# Patient Record
Sex: Female | Born: 1965 | Race: White | Hispanic: No | Smoking: Never smoker
Health system: Southern US, Community
[De-identification: ages and names within clinical notes are randomized; demographics above are authoritative.]

## PROBLEM LIST (undated history)

## (undated) DIAGNOSIS — F419 Anxiety disorder, unspecified: Secondary | ICD-10-CM

## (undated) DIAGNOSIS — K219 Gastro-esophageal reflux disease without esophagitis: Secondary | ICD-10-CM

## (undated) DIAGNOSIS — T7840XA Allergy, unspecified, initial encounter: Secondary | ICD-10-CM

## (undated) HISTORY — DX: Allergy, unspecified, initial encounter: T78.40XA

## (undated) HISTORY — PX: COSMETIC SURGERY: SHX468

## (undated) HISTORY — PX: PLACEMENT OF BREAST IMPLANTS: SHX6334

## (undated) HISTORY — PX: AUGMENTATION MAMMAPLASTY: SUR837

## (undated) HISTORY — DX: Anxiety disorder, unspecified: F41.9

---

## 2004-02-05 ENCOUNTER — Encounter: Admission: RE | Admit: 2004-02-05 | Discharge: 2004-02-05 | Payer: Self-pay | Admitting: Plastic Surgery

## 2017-06-17 ENCOUNTER — Encounter: Payer: Self-pay | Admitting: Obstetrics & Gynecology

## 2017-06-17 ENCOUNTER — Ambulatory Visit (INDEPENDENT_AMBULATORY_CARE_PROVIDER_SITE_OTHER): Payer: Self-pay | Admitting: Obstetrics & Gynecology

## 2017-06-17 VITALS — BP 139/80 | HR 69 | Ht 68.0 in | Wt 136.0 lb

## 2017-06-17 DIAGNOSIS — Z1151 Encounter for screening for human papillomavirus (HPV): Secondary | ICD-10-CM

## 2017-06-17 DIAGNOSIS — N852 Hypertrophy of uterus: Secondary | ICD-10-CM

## 2017-06-17 DIAGNOSIS — Z124 Encounter for screening for malignant neoplasm of cervix: Secondary | ICD-10-CM

## 2017-06-17 DIAGNOSIS — Z01419 Encounter for gynecological examination (general) (routine) without abnormal findings: Secondary | ICD-10-CM

## 2017-06-17 NOTE — Progress Notes (Signed)
Subjective:    Maria Hanson is a 51 y.o. MW P1 10(52 yo daughter + raising 3 others) female who presents for an annual exam. The patient has no complaints today. Perimenopausal symptoms, some increased sensitivity in left breast. Due for mammogram.Also sometimes feels a little pelvic "pressure".  The patient is sexually active. GYN screening history: last pap: was normal. The patient wears seatbelts: yes. The patient participates in regular exercise: yes. Has the patient ever been transfused or tattooed?: no. The patient reports that there is not domestic violence in her life.   Menstrual History: OB History    Gravida Para Term Preterm AB Living   1 1       1    SAB TAB Ectopic Multiple Live Births                  Menarche age: [age] Patient's last menstrual period was 06/08/2017.    The following portions of the patient's history were reviewed and updated as appropriate: allergies, current medications, past family history, past medical history, past social history, past surgical history and problem list.  Review of Systems Pertinent items are noted in HPI.   FH- no breast/gyn cancer, mat uncle with colon cancer, mat cousin with Chron's +muncle with skin cancer "on the inside", m aunt with lung cancer, m uncle with stomach Fam med- none Declines flu vaccine Periods monthly Using withdrawal for contraception for 34 years   Objective:    BP 139/80   Pulse 69   Ht 5\' 8"  (1.727 m)   Wt 136 lb (61.7 kg)   LMP 06/08/2017   BMI 20.68 kg/m   General Appearance:    Alert, cooperative, no distress, appears stated age  Head:    Normocephalic, without obvious abnormality, atraumatic  Eyes:    PERRL, conjunctiva/corneas clear, EOM's intact, fundi    benign, both eyes  Ears:    Normal TM's and external ear canals, both ears  Nose:   Nares normal, septum midline, mucosa normal, no drainage    or sinus tenderness  Throat:   Lips, mucosa, and tongue normal; teeth and gums normal  Neck:    Supple, symmetrical, trachea midline, no adenopathy;    thyroid:  no enlargement/tenderness/nodules; no carotid   bruit or JVD  Back:     Symmetric, no curvature, ROM normal, no CVA tenderness  Lungs:     Clear to auscultation bilaterally, respirations unlabored  Chest Wall:    No tenderness or deformity   Heart:    Regular rate and rhythm, S1 and S2 normal, no murmur, rub   or gallop  Breast Exam:    No tenderness, masses, or nipple abnormality  Abdomen:     Soft, non-tender, bowel sounds active all four quadrants,    no masses, no organomegaly  Genitalia:    Normal female without lesion, discharge or tenderness, lumpy, mobile uterus versus adnexal mass, non- tender     Extremities:   Extremities normal, atraumatic, no cyanosis or edema  Pulses:   2+ and symmetric all extremities  Skin:   Skin color, texture, turgor normal, no rashes or lesions  Lymph nodes:   Cervical, supraclavicular, and axillary nodes normal  Neurologic:   CNII-XII intact, normal strength, sensation and reflexes    throughout  .    Assessment:    Healthy female exam.   Abnormal bimanual exam   Plan:     Thin prep Pap smear. with cotesting Mammogram Refer to fam med Gyn u/s  My Risk test

## 2017-06-18 ENCOUNTER — Ambulatory Visit (INDEPENDENT_AMBULATORY_CARE_PROVIDER_SITE_OTHER): Payer: Self-pay

## 2017-06-18 DIAGNOSIS — N852 Hypertrophy of uterus: Secondary | ICD-10-CM

## 2017-06-21 LAB — CYTOLOGY - PAP
Diagnosis: NEGATIVE
HPV: NOT DETECTED

## 2017-06-22 ENCOUNTER — Telehealth: Payer: Self-pay | Admitting: *Deleted

## 2017-06-22 NOTE — Telephone Encounter (Signed)
-----   Message from Allie BossierMyra C Dove, MD sent at 06/21/2017  4:30 PM EST ----- Her u/s confirmed fibroids. If she's not having any problems, and she wasn't at her annual exam, the  I will just follow her fibroids with bimanual exam every year.

## 2017-06-22 NOTE — Telephone Encounter (Signed)
Pt notified of U/S results and to make sure that she has a bimanuel exam yearly.  She is instructed to call if she has pain or irregular bleeding.

## 2017-06-23 ENCOUNTER — Ambulatory Visit: Payer: Self-pay

## 2019-06-26 ENCOUNTER — Encounter: Payer: Self-pay | Admitting: *Deleted

## 2019-10-18 ENCOUNTER — Other Ambulatory Visit: Payer: Self-pay | Admitting: Obstetrics & Gynecology

## 2019-10-18 DIAGNOSIS — Z1231 Encounter for screening mammogram for malignant neoplasm of breast: Secondary | ICD-10-CM

## 2019-10-19 ENCOUNTER — Other Ambulatory Visit: Payer: Self-pay | Admitting: Obstetrics & Gynecology

## 2019-10-19 ENCOUNTER — Ambulatory Visit
Admission: RE | Admit: 2019-10-19 | Discharge: 2019-10-19 | Disposition: A | Discharge status: Home / Self Care | Payer: No Typology Code available for payment source | Source: Ambulatory Visit

## 2019-10-19 ENCOUNTER — Other Ambulatory Visit: Payer: Self-pay

## 2019-10-19 DIAGNOSIS — Z1231 Encounter for screening mammogram for malignant neoplasm of breast: Secondary | ICD-10-CM

## 2020-10-28 IMAGING — MG DIGITAL SCREENING BREAST BILAT IMPLANT W/ TOMO W/ CAD
9 of 12 series · 9 of 28 positions shown · non-contrast
Comparison: None.

CLINICAL DATA: Screening.

EXAM:
DIGITAL SCREENING BILATERAL MAMMOGRAM WITH IMPLANTS, CAD AND TOMO
The patient has retropectoral implants. Standard and implant
displaced views were performed.

[R MLO]
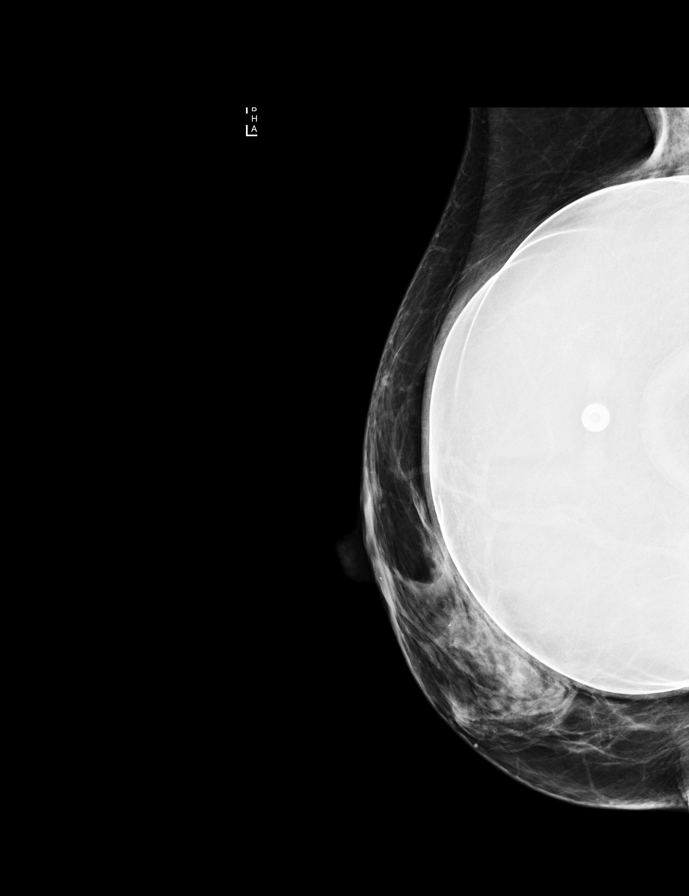

[R CC]
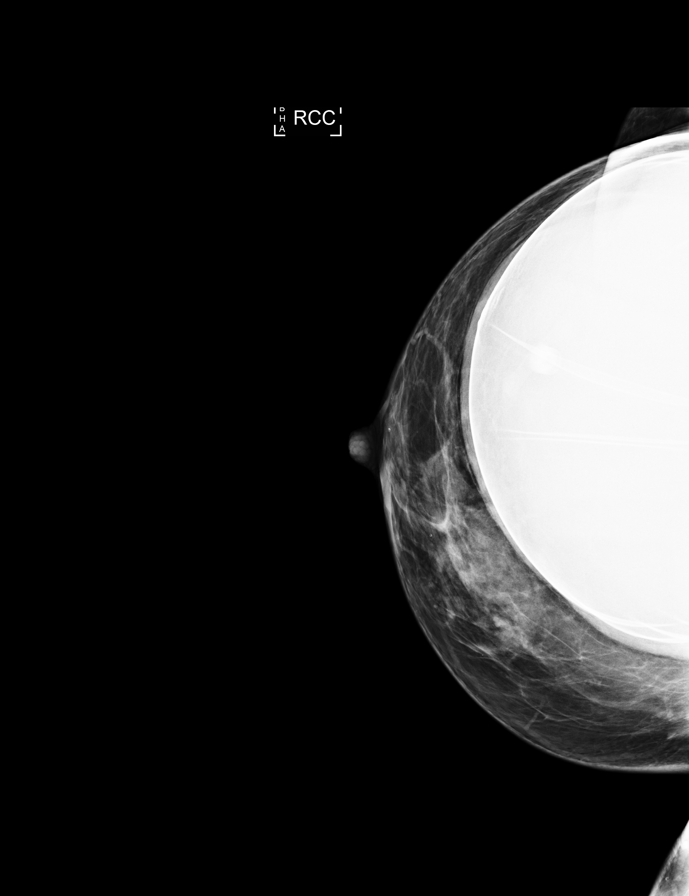

[L MLO]
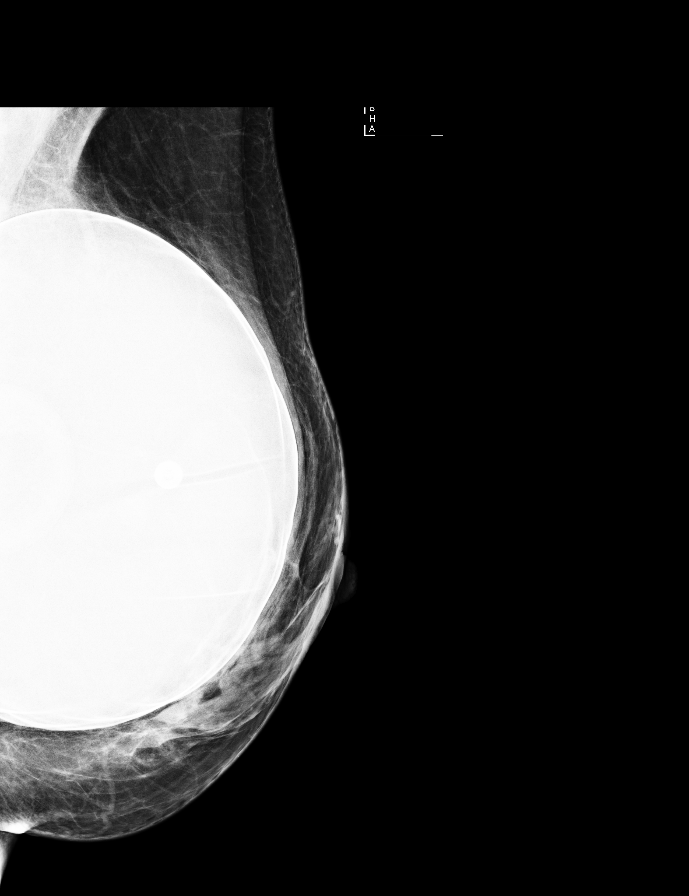

[L CC]
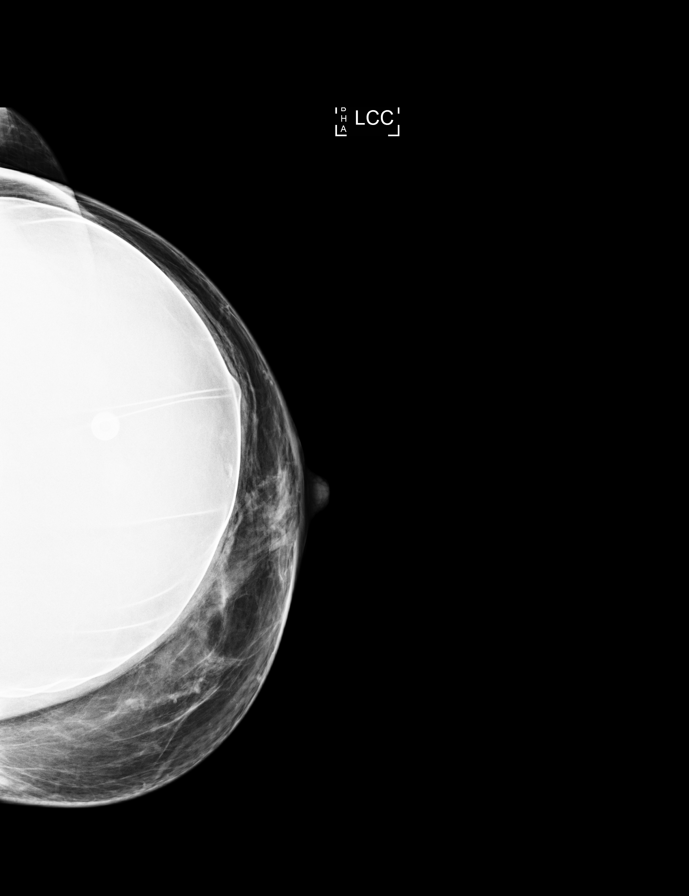

[L CC synth-2D]
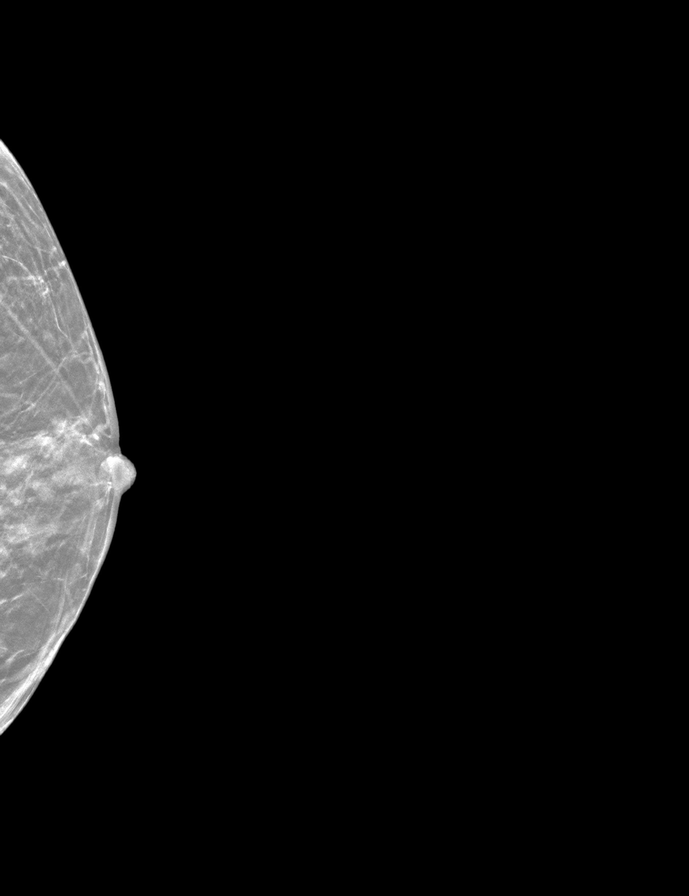

[L MLO synth-2D]
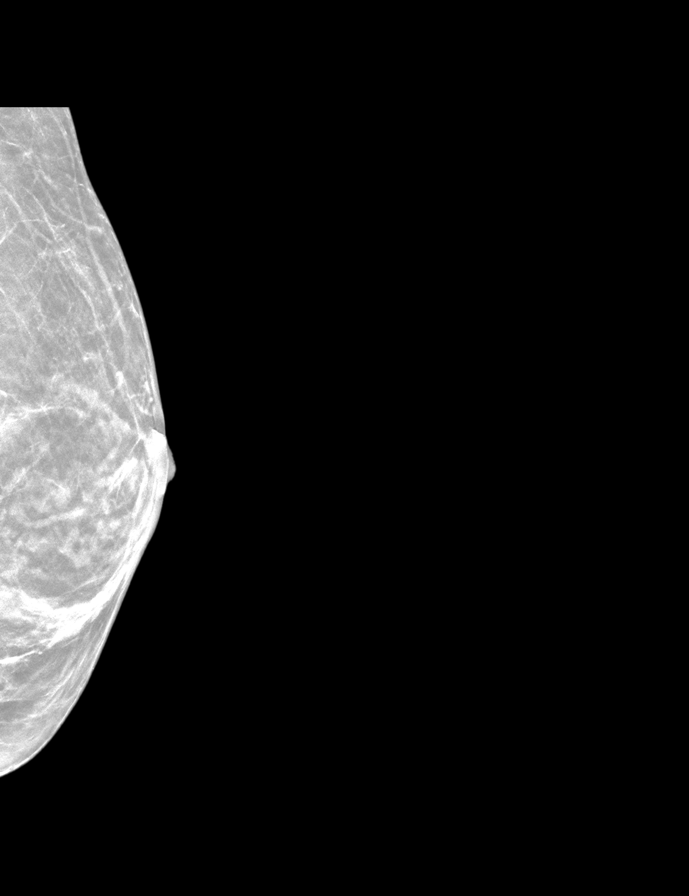

[R MLO synth-2D]
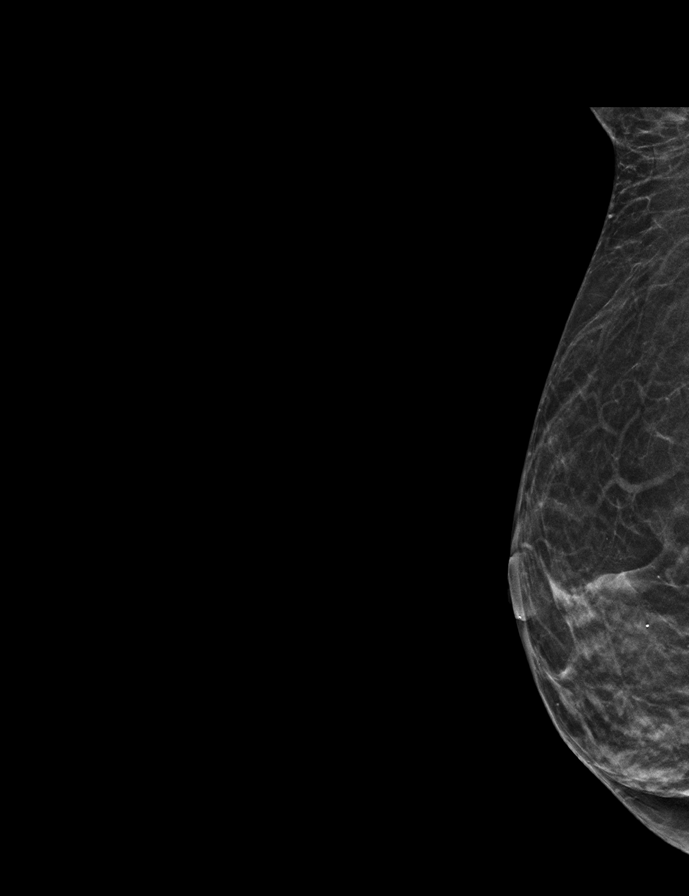

[R CC synth-2D]
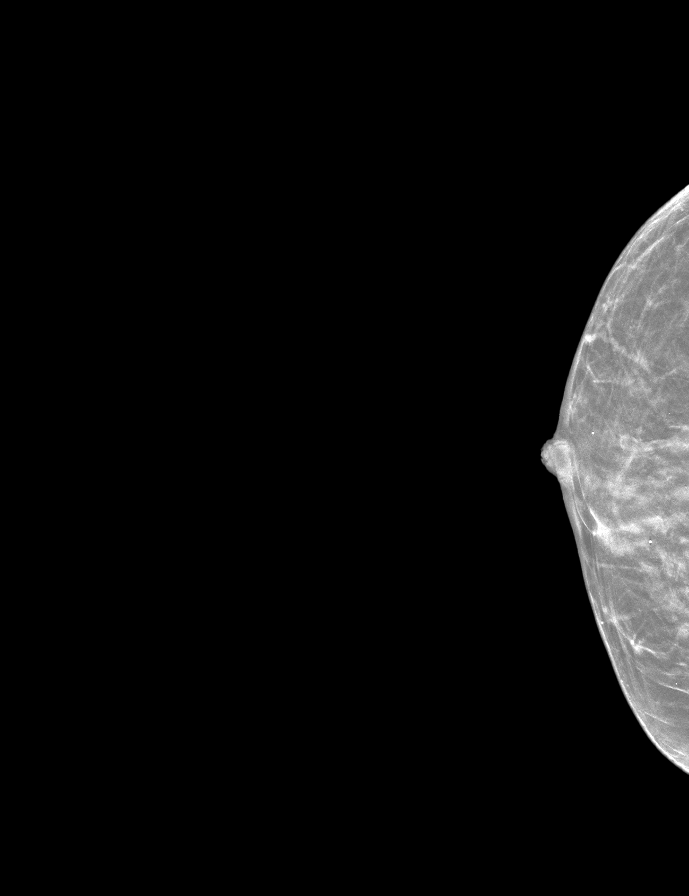

[L MLOID BREAST TOMOSYNTHESIS IMAGE tomo · tomo slice 15/30.0]
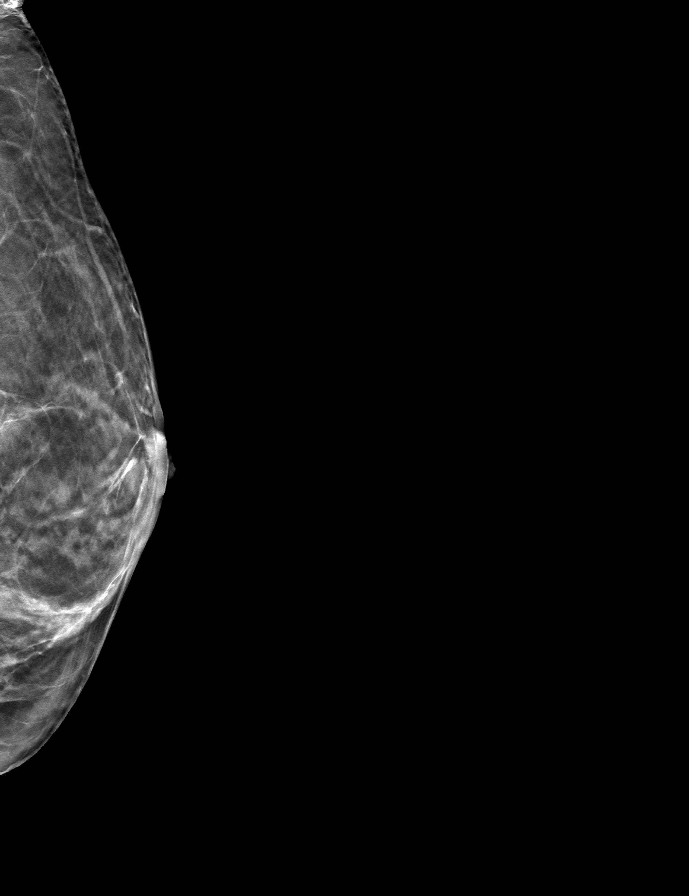

[9 of 28 positions shown; findings below may reference images not displayed]

ACR Breast Density Category c: The breast tissue is heterogeneously
dense, which may obscure small masses.
FINDINGS: There are no findings suspicious for malignancy. Images were
processed with CAD.
IMPRESSION: No mammographic evidence of malignancy. A result letter of this
screening mammogram will be mailed directly to the patient.

RECOMMENDATION:
Screening mammogram in one year. (Code:T2-O-MB8)

BI-RADS CATEGORY  1:  Negative.

## 2020-11-20 ENCOUNTER — Emergency Department (HOSPITAL_BASED_OUTPATIENT_CLINIC_OR_DEPARTMENT_OTHER)
Admission: EM | Admit: 2020-11-20 | Discharge: 2020-11-21 | Disposition: A | Discharge status: Home / Self Care | Payer: No Typology Code available for payment source | Attending: Emergency Medicine | Admitting: Emergency Medicine

## 2020-11-20 ENCOUNTER — Other Ambulatory Visit: Payer: Self-pay

## 2020-11-20 ENCOUNTER — Encounter (HOSPITAL_BASED_OUTPATIENT_CLINIC_OR_DEPARTMENT_OTHER): Payer: Self-pay

## 2020-11-20 ENCOUNTER — Emergency Department (HOSPITAL_BASED_OUTPATIENT_CLINIC_OR_DEPARTMENT_OTHER): Payer: No Typology Code available for payment source

## 2020-11-20 DIAGNOSIS — S61219A Laceration without foreign body of unspecified finger without damage to nail, initial encounter: Secondary | ICD-10-CM

## 2020-11-20 DIAGNOSIS — S61412A Laceration without foreign body of left hand, initial encounter: Secondary | ICD-10-CM

## 2020-11-20 DIAGNOSIS — Y93E5 Activity, floor mopping and cleaning: Secondary | ICD-10-CM | POA: Insufficient documentation

## 2020-11-20 DIAGNOSIS — W268XXA Contact with other sharp object(s), not elsewhere classified, initial encounter: Secondary | ICD-10-CM | POA: Insufficient documentation

## 2020-11-20 DIAGNOSIS — Z23 Encounter for immunization: Secondary | ICD-10-CM | POA: Insufficient documentation

## 2020-11-20 DIAGNOSIS — S62651B Nondisplaced fracture of medial phalanx of left index finger, initial encounter for open fracture: Secondary | ICD-10-CM

## 2020-11-20 DIAGNOSIS — S62653B Nondisplaced fracture of medial phalanx of left middle finger, initial encounter for open fracture: Secondary | ICD-10-CM | POA: Insufficient documentation

## 2020-11-20 MED ORDER — TETANUS-DIPHTH-ACELL PERTUSSIS 5-2.5-18.5 LF-MCG/0.5 IM SUSY
0.5000 mL | PREFILLED_SYRINGE | Freq: Once | INTRAMUSCULAR | Status: AC
  Administered 2020-11-20: 0.5 mL via INTRAMUSCULAR
  Filled 2020-11-20: qty 0.5

## 2020-11-20 MED ORDER — CEFAZOLIN SODIUM-DEXTROSE 1-4 GM/50ML-% IV SOLN
1.0000 g | Freq: Once | INTRAVENOUS | Status: AC
  Administered 2020-11-20: 1 g via INTRAVENOUS
  Filled 2020-11-20: qty 50

## 2020-11-20 MED ORDER — LIDOCAINE HCL (PF) 1 % IJ SOLN
5.0000 mL | Freq: Once | INTRAMUSCULAR | Status: AC
  Administered 2020-11-20: 5 mL
  Filled 2020-11-20: qty 5

## 2020-11-20 NOTE — ED Provider Notes (Signed)
[facility] EMERGENCY DEPARTMENT Provider Note   CSN: [id number] Arrival date & time: 11/20/20  2057     History Chief Complaint  Patient presents with  . Finger Injury    Maria Hanson is a 55 y.o. female.  HPI      55 year old female presents with concern for left index finger injury after accidentally turning the blender on with her left index finger inside.  Incident happened 15 minutes prior to arrival.  Reports her last tetanus shot was 5 years ago.  Reports pain to the index finger, denies numbness.  She was cleaning peanut butter out of the bottom of the blender when this occurred in her hand is also covered in peanut butter.  Bleeding controlled with dressing placed at triage.   History reviewed. No pertinent past medical history.  There are no problems to display for this patient.   Past Surgical History:  Procedure Laterality Date  . AUGMENTATION MAMMAPLASTY Bilateral 2006/2007  . PLACEMENT OF BREAST IMPLANTS       OB History    Gravida  1   Para  1   Term      Preterm      AB      Living  1     SAB      IAB      Ectopic      Multiple      Live Births              Family History  Problem Relation Age of Onset  . Lung cancer Maternal Aunt   . Skin cancer Maternal Uncle   . Stomach cancer Maternal Uncle     Social History   Tobacco Use  . Smoking status: Never Smoker  . Smokeless tobacco: Never Used  Vaping Use  . Vaping Use: Never used  Substance Use Topics  . Alcohol use: No  . Drug use: No    Home Medications Prior to Admission medications   Medication Sig Start Date End Date Taking? Authorizing Provider  cephALEXin (KEFLEX) 500 MG capsule Take 1 capsule (500 mg total) by mouth 3 (three) times daily for 7 days. 11/21/20 11/28/20 Yes Alvira Monday, MD    Allergies    Patient has no known allergies.  Review of Systems   Review of Systems  Constitutional: Negative for fever.  Musculoskeletal: Positive  for arthralgias and myalgias.  Skin: Positive for wound.    Physical Exam Updated Vital Signs BP 134/80   Pulse 75   Temp 98.4 F (36.9 C) (Oral)   Resp 18   Ht 5\' 8"  (1.727 m)   Wt 61.2 kg   LMP 06/08/2017   SpO2 97%   BMI 20.53 kg/m   Physical Exam Vitals and nursing note reviewed.  Constitutional:      General: She is not in acute distress.    Appearance: Normal appearance. She is not ill-appearing, toxic-appearing or diaphoretic.  HENT:     Head: Normocephalic.  Eyes:     Conjunctiva/sclera: Conjunctivae normal.  Cardiovascular:     Rate and Rhythm: Normal rate and regular rhythm.     Pulses: Normal pulses.  Pulmonary:     Effort: Pulmonary effort is normal. No respiratory distress.  Musculoskeletal:        General: No deformity or signs of injury.     Cervical back: No rigidity.     Comments: Unable to flex at DIP, is able to flex PIP, MCP  Skin:  General: Skin is warm and dry.     Coloration: Skin is not jaundiced or pale.  Neurological:     General: No focal deficit present.     Mental Status: She is alert and oriented to person, place, and time.           ED Results / Procedures / Treatments   Labs (all labs ordered are listed, but only abnormal results are displayed) Labs Reviewed - No data to display  EKG None  Radiology DG Finger Index Left  Result Date: 11/20/2020 CLINICAL DATA:  Injury EXAM: LEFT INDEX FINGER 2+V COMPARISON:  None. FINDINGS: Linear hyperdense structure seen adjacent to the radial aspect of the distal phalanx which could represent a chip fracture. There is slight radial subluxation of the distal phalanx. There is also a chip fracture seen on the palmar surface of the middle phalanx the D IP joint. Overlying soft tissue swelling and laceration is noted. IMPRESSION: Slight radial subluxation of the distal phalanx with a probable tiny chip fracture Chip fracture seen on the palmar surface of the middle phalanx Electronically  Signed   By: Jonna Clark M.D.   On: 11/20/2020 21:27    Procedures Procedures   Medications Ordered in ED Medications  ceFAZolin (ANCEF) IVPB 1 g/50 mL premix ( Intravenous Stopped 11/20/20 2213)  Tdap (BOOSTRIX) injection 0.5 mL (0.5 mLs Intramuscular Given 11/20/20 2148)  lidocaine (PF) (XYLOCAINE) 1 % injection 5 mL (5 mLs Other Given by Other 11/20/20 2315)    ED Course  I have reviewed the triage vital signs and the nursing notes.  Pertinent labs & imaging results that were available during my care of the patient were reviewed by me and considered in my medical decision making (see chart for details).    MDM Rules/Calculators/A&P                          55 year old female presents with concern for left index finger injury after accidentally turning the blender on with her left index finger inside.  XR with chip fracture middle phalanx, slight radial subluxation.   Exam significant for inability to flex at DIP with concern for possible tendon injury and areas of devitalized tissue.    Consulted Dr. Melvyn Novas of hand surgery who reviewed the images, and spoke with patient and husband over the phone.  He discussed that he recommends coming to the office at 7:30 AM for evaluation, possible surgery in clinic or OR depending on evaluation.  Digital block performed by Aberman PA-C and wound irrigated. Xeroform and finger splint placed without further repair per Dr. Glenna Durand recommendation.  Given rx for keflex, pt to go to Dr. Glenna Durand office in the morning.   Final Clinical Impression(s) / ED Diagnoses Final diagnoses:  Laceration of multiple sites of left hand and fingers, initial encounter  Nondisplaced fracture of middle phalanx of left index finger, initial encounter for open fracture    Rx / DC Orders ED Discharge Orders         Ordered    cephALEXin (KEFLEX) 500 MG capsule  3 times daily        11/21/20 0016           Alvira Monday, MD 11/21/20 [phone]

## 2020-11-20 NOTE — ED Notes (Addendum)
PT soaking wound in a 50/50 Betadine/ Sterile water solution

## 2020-11-20 NOTE — ED Notes (Signed)
ED Provider at bedside. 

## 2020-11-20 NOTE — ED Triage Notes (Addendum)
Pt cut left index finger spinning blade of immersion blender ~15 min PTA-avulsion/near amputation noted-pt with peanut butter covering wound and hands-to be cleaned in tx area-to triage in w/c-NAD

## 2020-11-21 ENCOUNTER — Encounter (HOSPITAL_COMMUNITY): Payer: Self-pay | Admitting: Orthopedic Surgery

## 2020-11-21 ENCOUNTER — Ambulatory Visit (HOSPITAL_COMMUNITY): Payer: No Typology Code available for payment source

## 2020-11-21 ENCOUNTER — Encounter (HOSPITAL_COMMUNITY)
Admission: RE | Disposition: A | Discharge status: Home / Self Care | Payer: Self-pay | Source: Home / Self Care | Attending: Orthopedic Surgery

## 2020-11-21 ENCOUNTER — Ambulatory Visit (HOSPITAL_COMMUNITY)
Admission: RE | Admit: 2020-11-21 | Discharge: 2020-11-21 | Disposition: A | Discharge status: Home / Self Care | Payer: No Typology Code available for payment source | Attending: Orthopedic Surgery | Admitting: Orthopedic Surgery

## 2020-11-21 ENCOUNTER — Other Ambulatory Visit: Payer: Self-pay

## 2020-11-21 ENCOUNTER — Ambulatory Visit (HOSPITAL_COMMUNITY): Payer: No Typology Code available for payment source | Admitting: Anesthesiology

## 2020-11-21 DIAGNOSIS — K219 Gastro-esophageal reflux disease without esophagitis: Secondary | ICD-10-CM | POA: Insufficient documentation

## 2020-11-21 DIAGNOSIS — Z79899 Other long term (current) drug therapy: Secondary | ICD-10-CM | POA: Insufficient documentation

## 2020-11-21 DIAGNOSIS — S62631B Displaced fracture of distal phalanx of left index finger, initial encounter for open fracture: Secondary | ICD-10-CM | POA: Insufficient documentation

## 2020-11-21 DIAGNOSIS — S63431A Traumatic rupture of volar plate of left index finger at metacarpophalangeal and interphalangeal joint, initial encounter: Secondary | ICD-10-CM | POA: Insufficient documentation

## 2020-11-21 DIAGNOSIS — Z801 Family history of malignant neoplasm of trachea, bronchus and lung: Secondary | ICD-10-CM | POA: Insufficient documentation

## 2020-11-21 DIAGNOSIS — S63241A Subluxation of distal interphalangeal joint of left index finger, initial encounter: Secondary | ICD-10-CM | POA: Insufficient documentation

## 2020-11-21 DIAGNOSIS — Y9389 Activity, other specified: Secondary | ICD-10-CM | POA: Insufficient documentation

## 2020-11-21 DIAGNOSIS — S66121A Laceration of flexor muscle, fascia and tendon of left index finger at wrist and hand level, initial encounter: Secondary | ICD-10-CM | POA: Insufficient documentation

## 2020-11-21 DIAGNOSIS — S62631A Displaced fracture of distal phalanx of left index finger, initial encounter for closed fracture: Secondary | ICD-10-CM

## 2020-11-21 DIAGNOSIS — Z808 Family history of malignant neoplasm of other organs or systems: Secondary | ICD-10-CM | POA: Insufficient documentation

## 2020-11-21 DIAGNOSIS — Z20822 Contact with and (suspected) exposure to covid-19: Secondary | ICD-10-CM | POA: Insufficient documentation

## 2020-11-21 DIAGNOSIS — Z800 Family history of malignant neoplasm of digestive organs: Secondary | ICD-10-CM | POA: Insufficient documentation

## 2020-11-21 DIAGNOSIS — W3189XA Contact with other specified machinery, initial encounter: Secondary | ICD-10-CM | POA: Insufficient documentation

## 2020-11-21 HISTORY — DX: Gastro-esophageal reflux disease without esophagitis: K21.9

## 2020-11-21 HISTORY — PX: TENDON REPAIR: SHX5111

## 2020-11-21 HISTORY — PX: PERCUTANEOUS PINNING: SHX2209

## 2020-11-21 LAB — SARS CORONAVIRUS 2 BY RT PCR (HOSPITAL ORDER, PERFORMED IN ~~LOC~~ HOSPITAL LAB): SARS Coronavirus 2: NEGATIVE

## 2020-11-21 SURGERY — TENDON REPAIR
Anesthesia: Monitor Anesthesia Care | Site: Finger | Laterality: Left

## 2020-11-21 MED ORDER — AMISULPRIDE (ANTIEMETIC) 5 MG/2ML IV SOLN: 10.0000 mg | Freq: Once | INTRAVENOUS | Status: DC | PRN

## 2020-11-21 MED ORDER — ACETAMINOPHEN 500 MG PO TABS
ORAL_TABLET | ORAL | Status: AC
  Administered 2020-11-21: 1000 mg via ORAL
  Filled 2020-11-21: qty 2

## 2020-11-21 MED ORDER — LIDOCAINE HCL 1 % IJ SOLN
INTRAMUSCULAR | Status: AC
  Filled 2020-11-21: qty 20

## 2020-11-21 MED ORDER — KETOROLAC TROMETHAMINE 30 MG/ML IJ SOLN: 30.0000 mg | Freq: Once | INTRAMUSCULAR | Status: DC | PRN

## 2020-11-21 MED ORDER — FENTANYL CITRATE (PF) 100 MCG/2ML IJ SOLN
INTRAMUSCULAR | Status: DC | PRN
  Administered 2020-11-21 (×3): 50 ug via INTRAVENOUS

## 2020-11-21 MED ORDER — 0.9 % SODIUM CHLORIDE (POUR BTL) OPTIME
TOPICAL | Status: DC | PRN
  Administered 2020-11-21: 1000 mL

## 2020-11-21 MED ORDER — ORAL CARE MOUTH RINSE: 15.0000 mL | Freq: Once | OROMUCOSAL | Status: AC

## 2020-11-21 MED ORDER — CEFAZOLIN SODIUM-DEXTROSE 2-4 GM/100ML-% IV SOLN
INTRAVENOUS | Status: AC
  Filled 2020-11-21: qty 100

## 2020-11-21 MED ORDER — BUPIVACAINE HCL (PF) 0.25 % IJ SOLN
INTRAMUSCULAR | Status: AC
  Filled 2020-11-21: qty 30

## 2020-11-21 MED ORDER — OXYCODONE HCL 5 MG PO TABS: 5.0000 mg | ORAL_TABLET | Freq: Once | ORAL | Status: DC | PRN

## 2020-11-21 MED ORDER — FENTANYL CITRATE (PF) 250 MCG/5ML IJ SOLN
INTRAMUSCULAR | Status: AC
  Filled 2020-11-21: qty 5

## 2020-11-21 MED ORDER — PROPOFOL 10 MG/ML IV BOLUS
INTRAVENOUS | Status: AC
  Filled 2020-11-21: qty 20

## 2020-11-21 MED ORDER — OXYCODONE-ACETAMINOPHEN 5-325 MG PO TABS: 1.0000 | ORAL_TABLET | ORAL | 0 refills | Status: AC | PRN

## 2020-11-21 MED ORDER — CEPHALEXIN 500 MG PO CAPS: 500.0000 mg | ORAL_CAPSULE | Freq: Three times a day (TID) | ORAL | 0 refills | Status: AC

## 2020-11-21 MED ORDER — OXYCODONE HCL 5 MG/5ML PO SOLN: 5.0000 mg | Freq: Once | ORAL | Status: DC | PRN

## 2020-11-21 MED ORDER — CHLORHEXIDINE GLUCONATE 0.12 % MT SOLN
OROMUCOSAL | Status: AC
  Administered 2020-11-21: 15 mL via OROMUCOSAL
  Filled 2020-11-21: qty 15

## 2020-11-21 MED ORDER — CHLORHEXIDINE GLUCONATE 0.12 % MT SOLN: 15.0000 mL | Freq: Once | OROMUCOSAL | Status: AC

## 2020-11-21 MED ORDER — FENTANYL CITRATE (PF) 100 MCG/2ML IJ SOLN
INTRAMUSCULAR | Status: AC
  Filled 2020-11-21: qty 2

## 2020-11-21 MED ORDER — ACETAMINOPHEN 500 MG PO TABS: 1000.0000 mg | ORAL_TABLET | Freq: Once | ORAL | Status: AC

## 2020-11-21 MED ORDER — MIDAZOLAM HCL 2 MG/2ML IJ SOLN
INTRAMUSCULAR | Status: AC
  Filled 2020-11-21: qty 2

## 2020-11-21 MED ORDER — CEFAZOLIN SODIUM-DEXTROSE 2-3 GM-%(50ML) IV SOLR
INTRAVENOUS | Status: DC | PRN
  Administered 2020-11-21: 2 g via INTRAVENOUS

## 2020-11-21 MED ORDER — PROMETHAZINE HCL 25 MG/ML IJ SOLN
6.2500 mg | INTRAMUSCULAR | Status: DC | PRN
Start: 2020-11-21 — End: 2020-11-22

## 2020-11-21 MED ORDER — MIDAZOLAM HCL 5 MG/5ML IJ SOLN
INTRAMUSCULAR | Status: DC | PRN
  Administered 2020-11-21: 2 mg via INTRAVENOUS

## 2020-11-21 MED ORDER — FENTANYL CITRATE (PF) 100 MCG/2ML IJ SOLN
25.0000 ug | INTRAMUSCULAR | Status: DC | PRN
  Administered 2020-11-21: 25 ug via INTRAVENOUS

## 2020-11-21 MED ORDER — ONDANSETRON HCL 4 MG/2ML IJ SOLN
INTRAMUSCULAR | Status: DC | PRN
  Administered 2020-11-21: 4 mg via INTRAVENOUS

## 2020-11-21 MED ORDER — LIDOCAINE HCL 1 % IJ SOLN
INTRAMUSCULAR | Status: DC | PRN
  Administered 2020-11-21: 10 mL

## 2020-11-21 MED ORDER — PROPOFOL 10 MG/ML IV BOLUS
INTRAVENOUS | Status: DC | PRN
  Administered 2020-11-21: 20 mg via INTRAVENOUS

## 2020-11-21 MED ORDER — LACTATED RINGERS IV SOLN: INTRAVENOUS | Status: DC

## 2020-11-21 MED ORDER — PROPOFOL 500 MG/50ML IV EMUL
INTRAVENOUS | Status: DC | PRN
  Administered 2020-11-21: 75 ug/kg/min via INTRAVENOUS

## 2020-11-21 SURGICAL SUPPLY — 64 items
BNDG CMPR 9X4 STRL LF SNTH (GAUZE/BANDAGES/DRESSINGS) ×1
BNDG COHESIVE 1X5 TAN STRL LF (GAUZE/BANDAGES/DRESSINGS) ×1 IMPLANT
BNDG CONFORM 2 STRL LF (GAUZE/BANDAGES/DRESSINGS) ×1 IMPLANT
BNDG ELASTIC 3X5.8 VLCR STR LF (GAUZE/BANDAGES/DRESSINGS) IMPLANT
BNDG ELASTIC 4X5.8 VLCR STR LF (GAUZE/BANDAGES/DRESSINGS) IMPLANT
BNDG ESMARK 4X9 LF (GAUZE/BANDAGES/DRESSINGS) ×2 IMPLANT
BNDG GAUZE ELAST 4 BULKY (GAUZE/BANDAGES/DRESSINGS) IMPLANT
CORD BIPOLAR FORCEPS 12FT (ELECTRODE) ×2 IMPLANT
COVER SURGICAL LIGHT HANDLE (MISCELLANEOUS) ×2 IMPLANT
COVER WAND RF STERILE (DRAPES) ×2 IMPLANT
CUFF TOURN SGL QUICK 18X4 (TOURNIQUET CUFF) ×2 IMPLANT
CUFF TOURN SGL QUICK 24 (TOURNIQUET CUFF)
CUFF TRNQT CYL 24X4X16.5-23 (TOURNIQUET CUFF) IMPLANT
DRAPE SURG 17X23 STRL (DRAPES) ×2 IMPLANT
DRSG ADAPTIC 3X8 NADH LF (GAUZE/BANDAGES/DRESSINGS) ×1 IMPLANT
GAUZE SPONGE 2X2 8PLY STRL LF (GAUZE/BANDAGES/DRESSINGS) IMPLANT
GAUZE SPONGE 4X4 12PLY STRL (GAUZE/BANDAGES/DRESSINGS) IMPLANT
GAUZE XEROFORM 1X8 LF (GAUZE/BANDAGES/DRESSINGS) ×1 IMPLANT
GLOVE BIOGEL PI IND STRL 8.5 (GLOVE) ×1 IMPLANT
GLOVE BIOGEL PI INDICATOR 8.5 (GLOVE) ×1
GLOVE SURG ORTHO 8.0 STRL STRW (GLOVE) ×2 IMPLANT
GOWN STRL REUS W/ TWL LRG LVL3 (GOWN DISPOSABLE) ×2 IMPLANT
GOWN STRL REUS W/ TWL XL LVL3 (GOWN DISPOSABLE) ×1 IMPLANT
GOWN STRL REUS W/TWL LRG LVL3 (GOWN DISPOSABLE) ×4
GOWN STRL REUS W/TWL XL LVL3 (GOWN DISPOSABLE) ×2
K-WIRE .045X4 (WIRE) ×1 IMPLANT
KIT BASIN OR (CUSTOM PROCEDURE TRAY) ×2 IMPLANT
KIT TURNOVER KIT B (KITS) ×2 IMPLANT
MANIFOLD NEPTUNE II (INSTRUMENTS) ×2 IMPLANT
NDL HYPO 25GX1X1/2 BEV (NEEDLE) IMPLANT
NEEDLE HYPO 25GX1X1/2 BEV (NEEDLE) ×4 IMPLANT
NS IRRIG 1000ML POUR BTL (IV SOLUTION) ×2 IMPLANT
PACK ORTHO EXTREMITY (CUSTOM PROCEDURE TRAY) ×2 IMPLANT
PAD ARMBOARD 7.5X6 YLW CONV (MISCELLANEOUS) ×4 IMPLANT
PAD CAST 4YDX4 CTTN HI CHSV (CAST SUPPLIES) IMPLANT
PADDING CAST COTTON 4X4 STRL (CAST SUPPLIES)
SET CYSTO W/LG BORE CLAMP LF (SET/KITS/TRAYS/PACK) IMPLANT
SOAP 2 % CHG 4 OZ (WOUND CARE) ×2 IMPLANT
SPECIMEN JAR SMALL (MISCELLANEOUS) ×2 IMPLANT
SPONGE GAUZE 2X2 STER 10/PKG (GAUZE/BANDAGES/DRESSINGS)
SUCTION FRAZIER HANDLE 10FR (MISCELLANEOUS)
SUCTION TUBE FRAZIER 10FR DISP (MISCELLANEOUS) IMPLANT
SUT CHROMIC 4 0 PS 2 18 (SUTURE) ×1 IMPLANT
SUT ETHIBOND 4 0 TF (SUTURE) ×1 IMPLANT
SUT ETHILON 8 0 BV130 4 (SUTURE) IMPLANT
SUT ETHILON 9 0 BV130 4 (SUTURE) IMPLANT
SUT FIBER WIRE 4.0 (SUTURE) ×1 IMPLANT
SUT FIBERWIRE 2-0 18 17.9 3/8 (SUTURE)
SUT FIBERWIRE 3-0 18 TAPR NDL (SUTURE)
SUT MERSILENE 4 0 P 3 (SUTURE) IMPLANT
SUT PROLENE 4 0 PS 2 18 (SUTURE) ×3 IMPLANT
SUT PROLENE 5 0 PS 2 (SUTURE) IMPLANT
SUT PROLENE 6 0 P 1 18 (SUTURE) IMPLANT
SUT VIC AB 2-0 CT1 27 (SUTURE)
SUT VIC AB 2-0 CT1 TAPERPNT 27 (SUTURE) IMPLANT
SUTURE FIBERWR 2-0 18 17.9 3/8 (SUTURE) IMPLANT
SUTURE FIBERWR 3-0 18 TAPR NDL (SUTURE) IMPLANT
SYR CONTROL 10ML LL (SYRINGE) IMPLANT
TOWEL GREEN STERILE (TOWEL DISPOSABLE) ×2 IMPLANT
TOWEL GREEN STERILE FF (TOWEL DISPOSABLE) ×2 IMPLANT
TUBE CONNECTING 12X1/4 (SUCTIONS) ×1 IMPLANT
TUBE FEEDING ENTERAL 5FR 16IN (TUBING) IMPLANT
UNDERPAD 30X36 HEAVY ABSORB (UNDERPADS AND DIAPERS) ×2 IMPLANT
WATER STERILE IRR 1000ML POUR (IV SOLUTION) ×2 IMPLANT

## 2020-11-21 NOTE — Op Note (Signed)
PREOPERATIVE DIAGNOSIS: Left index finger open distal phalanx fracture and open distal interphalangeal joint with tendon laceration and soft tissue disarray and flexor digitorum profundus tendon laceration  POSTOPERATIVE DIAGNOSIS: Same  ATTENDING SURGEON: Dr. Bradly Bienenstock who scrubbed and present for the entire procedure  ASSISTANT SURGEON: None  ANESTHESIA: 1% Xylocaine local block with IV sedation  OPERATIVE PROCEDURE: #1: Open debridement of skin subcutaneous tissue and bone associated with left index finger open distal phalangeal fracture and DIP joint subluxation #2: Open treatment of left index finger distal phalanx fracture involving the articular portion of the DIP joint requiring internal fixation #3: Open treatment of left index finger volar plate laceration with primary volar plate repair #4: Left index finger flexor digitorum profundus tendon repair zone 1 primary tendon repair #5: Complex laceration repair left index finger multiple lacerations greater than 6 cm. #6: Radiographs 2 views left index finger  IMPLANTS: One 0.045 K wire  EBL: Minimal  RADIOGRAPHIC INTERPRETATION: AP lateral views of the finger do show the K wire across the DIP joint with good alignment of the distal interphalangeal joint.  SURGICAL INDICATIONS: Patient is a right-hand-dominant female who sustained the injury when her hand got caught in a blender.  Patient sustained the injury to the index finger.  Patient was seen evaluate the office and recommended undergo the above procedure.  The risks of surgery include but not limited to bleeding infection damage nearby nerves arteries or tendons loss of motion of the wrist and digits incomplete relief of symptoms and need for further surgical intervention loss of the fingertip tendon stiffness loss of motion and need for further surgical invention.  SURGICAL TECHNIQUE: The patient was palpated via the preoperative holding area marked apart a marker made the  left index finger to indicate correct operative site.  Patient brought back operating placed supine on anesthesia table where the regional sedation was administered.  Local anesthetic was administered 1% Xylocaine.  Patient tolerated this well.  Well-padded tourniquet placed on the left brachium and seal with the appropriate drape.  The left upper extremities then prepped and draped in normal sterile fashion.  A timeout was called the correct site was identified the procedure then begun.  Attention was then turned to the dorsal aspect of the finger.  The patient did have the complex lacerations.  The lacerations were then thoroughly irrigated.  The extensor these dorsal lacerations extended down to the extensor mechanism but the extensor mechanism was in continuity.  Following this the complex lacerations were then debrided.  Debridement of the skin subcutaneous tissue was then carried out of these areas.  This was done with sharp scissors and a knife.  These were then loosely reapproximated with 4-0 chromic in simple Prolene sutures along the dorsal aspect of the finger.  After debridement dorsally attention was then turned volarly.  The bulk of the wound was volarly.  Open debridement was then carried out of the skin subcutaneous tissue of the devitalized tissue with sharp scissors and scalpel.  The bone fragments that within the DIP joint were then excised.  These were loose fragments with very little to no soft tissue attachments.  After open debridement of the fracture and of the open joint the joint was then thoroughly irrigated.  Following this a 0.045 K wire was then placed from distal to proximal the joint was then reduced and the K wire was then placed across the joint to stabilize the distal interphalangeal joint.  The K wire was confirmed using the  mini C arm.  After confirmation of the K wire attention was then turned to repair of the volar plate the volar plate was then reapproximated the oblique  laceration to the volar plate was then repaired with 4-0 Ethibond suture.  The flexor digitorum profundus was completely transected.  Once this was noted both hands were then carefully identified and the tendon was then repaired.  4-0 FiberWire suture with 2 horizontal core mattress sutures and 2 simple sutures along the sides totaling a six strand repair was then done of the FDP primarily.  The wound was then thoroughly irrigated.  After open treatment of the articular fracture the distal interphalangeal joint and volar plate and tendon repair the skin on the volar surface was then reapproximated and closed with simple Prolene suture.  Adaptic dressing sterile compressive bandage then applied.  The patient was placed in a small finger splint after the K wires then cut and bent left out of the skin.  Patient then taken recovery in good condition.  The tourniquet been deflated with good perfusion the fingertip and good perfusion the skin flaps.  Debridement type: Excisional Debridement  Side: left  Body Location: left index finger   Tools used for debridement: scalpel and scissors  Pre-debridement Wound size (cm):   Length: 6      Width: 1   Depth: 1 cm   Post-debridement Wound size (cm):   Length: 7        Width:1     Depth: 1  Debridement depth beyond dead/damaged tissue down to healthy viable tissue: yes  Tissue layer involved: skin, subcutaneous tissue, muscle / fascia, bone  Nature of tissue removed: Devitalized Tissue  Irrigation volume:     Irrigation fluid type: Normal Saline      POSTOPERATIVE PLAN: The patient is to be discharged to home.  See him back in the office in 1 week.  We will take the splint off look at the skin flaps.  X-rays send her down to see our therapist for tip protector splint.  We will protect the distal interphalangeal joint for 4 weeks.  After that we will begin an outpatient therapy regimen will begin working on motion at the FDP tendon repair.   Radiographs at each visit.  Very guarded at the current time about the viability of the skin flaps.  The patient had the circumferential lacerations to the finger concerned about the viability of some of the skin flaps.  We will go very slow over the next few weeks likely sutures out around the 3-week mark.  Radiographs at each visit.

## 2020-11-21 NOTE — H&P (Signed)
Maria Hanson is an 55 y.o. female.   Chief Complaint: HPI: Left index finger injury.  Patient is a right-hand-dominant female who unfortunately caught her left index finger caught in a blender.  Patient seen evaluate in the office today and recommended undergo irrigation and debridement and repair of the lacerated structures.  No prior injury to the index finger.  Past Medical History:  Diagnosis Date  . GERD (gastroesophageal reflux disease)     Past Surgical History:  Procedure Laterality Date  . AUGMENTATION MAMMAPLASTY Bilateral 2006/2007  . PLACEMENT OF BREAST IMPLANTS      Family History  Problem Relation Age of Onset  . Lung cancer Maternal Aunt   . Skin cancer Maternal Uncle   . Stomach cancer Maternal Uncle    Social History:  reports that she has never smoked. She has never used smokeless tobacco. She reports that she does not drink alcohol and does not use drugs.  Allergies: No Known Allergies  Medications Prior to Admission  Medication Sig Dispense Refill  . cholecalciferol (VITAMIN D) 25 MCG (1000 UNIT) tablet Take 1,000 Units by mouth daily.    Marland Kitchen selenium 50 MCG TABS tablet Take 50 mcg by mouth daily.    . vitamin C (ASCORBIC ACID) 500 MG tablet Take 500 mg by mouth daily.    . Zinc 50 MG TABS Take 50 mg by mouth daily.    . cephALEXin (KEFLEX) 500 MG capsule Take 1 capsule (500 mg total) by mouth 3 (three) times daily for 7 days. (Patient not taking: Reported on 11/21/2020) 21 capsule 0    Results for orders placed or performed during the hospital encounter of 11/21/20 (from the past 48 hour(s))  SARS Coronavirus 2 by RT PCR (hospital order, performed in [facility] hospital lab) Nasopharyngeal Nasopharyngeal Swab     Status: None   Collection Time: 11/21/20 12:37 PM   Specimen: Nasopharyngeal Swab  Result Value Ref Range   SARS Coronavirus 2 NEGATIVE NEGATIVE    Comment: (NOTE) SARS-CoV-2 target nucleic acids are NOT DETECTED.  The SARS-CoV-2 RNA is  generally detectable in upper and lower respiratory specimens during the acute phase of infection. The lowest concentration of SARS-CoV-2 viral copies this assay can detect is 250 copies / mL. A negative result does not preclude SARS-CoV-2 infection and should not be used as the sole basis for treatment or other patient management decisions.  A negative result may occur with improper specimen collection / handling, submission of specimen other than nasopharyngeal swab, presence of viral mutation(s) within the areas targeted by this assay, and inadequate number of viral copies (<250 copies / mL). A negative result must be combined with clinical observations, patient history, and epidemiological information.  Fact Sheet for Patients:   BoilerBrush.com.cy  Fact Sheet for Healthcare Providers: https://pope.com/  This test is not yet approved or  cleared by the [place] FDA and has been authorized for detection and/or diagnosis of SARS-CoV-2 by FDA under an Emergency Use Authorization (EUA).  This EUA will remain in effect (meaning this test can be used) for the duration of the COVID-19 declaration under Section 564(b)(1) of the Act, 21 U.S.C. section 360bbb-3(b)(1), unless the authorization is terminated or revoked sooner.  Performed at [facility], [address] [place], [place] [address]    DG Finger Index Left  Result Date: 11/20/2020 CLINICAL DATA:  Injury EXAM: LEFT INDEX FINGER 2+V COMPARISON:  None. FINDINGS: Linear hyperdense structure seen adjacent to the radial aspect of  the distal phalanx which could represent a chip fracture. There is slight radial subluxation of the distal phalanx. There is also a chip fracture seen on the palmar surface of the middle phalanx the D IP joint. Overlying soft tissue swelling and laceration is noted. IMPRESSION: Slight radial subluxation of the distal phalanx with a probable tiny chip  fracture Chip fracture seen on the palmar surface of the middle phalanx Electronically Signed   By: Jonna Clark M.D.   On: 11/20/2020 21:27    ROS no chest pain shortness of breath cardiovascular pulmonary renal musculoskeletal complaints other than the discomfort to the index finger  Blood pressure 126/76, pulse 78, temperature 98.2 F (36.8 C), temperature source Oral, resp. rate 18, height 5\' 8"  (1.727 m), weight 61.2 kg, last menstrual period 06/08/2017, SpO2 100 %. Physical Exam  General Appearance:  Alert, cooperative, no distress, appears stated age  Head:  Normocephalic, without obvious abnormality, atraumatic  Eyes:  Pupils equal, conjunctiva/corneas clear,         Throat: Lips, mucosa, and tongue normal; teeth and gums normal  Neck: No visible masses     Lungs:   respirations unlabored  Chest Wall:  No tenderness or deformity  Heart:  Regular rate and rhythm,  Abdomen:   Soft, non-tender,         Extremities:  The patient does have the bandage in place.  The patient is able to gently wiggle the index finger good capillary refill good blood flow.  Pulses: 2+ and symmetric  Skin: Skin color, texture, turgor normal, no rashes or lesions     Neurologic: Normal   Assessment/Plan Left index finger open distal phalanx fracture and soft tissue disarray  Today's findings reviewed with the patient we talked about the reason the rationale for wound exploration and repair of the indicated structures.  The patient did have the laceration through the FDP and into the joint.  We talked about repair of the FDP tendon as well as stabilization of the joint.  The risks of surgery include but not limited to bleeding infection damage nearby nerves arteries or tendons loss of motion of the wrist and digits incomplete relief of symptoms and need for further surgical invention.  Information was given to our surgical scheduler to do this today.  R/B/A DISCUSSED WITH PT IN OFFICE.  PT VOICED  UNDERSTANDING OF PLAN CONSENT SIGNED DAY OF SURGERY PT SEEN AND EXAMINED PRIOR TO OPERATIVE PROCEDURE/DAY OF SURGERY SITE MARKED. QUESTIONS ANSWERED WILL GO HOME FOLLOWING SURGERY  WE ARE PLANNING SURGERY FOR YOUR UPPER EXTREMITY. THE RISKS AND BENEFITS OF SURGERY INCLUDE BUT NOT LIMITED TO BLEEDING INFECTION, DAMAGE TO NEARBY NERVES ARTERIES TENDONS, FAILURE OF SURGERY TO ACCOMPLISH ITS INTENDED GOALS, PERSISTENT SYMPTOMS AND NEED FOR FURTHER SURGICAL INTERVENTION. WITH THIS IN MIND WE WILL PROCEED. I HAVE DISCUSSED WITH THE PATIENT THE PRE AND POSTOPERATIVE REGIMEN AND THE DOS AND DON'TS. PT VOICED UNDERSTANDING AND INFORMED CONSENT SIGNED.  06/10/2017 [facility] 11/21/2020, 6:01 PM

## 2020-11-21 NOTE — Anesthesia Preprocedure Evaluation (Signed)
Anesthesia Evaluation  Patient identified by MRN, date of birth, ID band Patient awake    Reviewed: Allergy & Precautions, NPO status , Patient's Chart, lab work & pertinent test results  Airway Mallampati: II  TM Distance: >3 FB Neck ROM: Full    Dental no notable dental hx.    Pulmonary neg pulmonary ROS,    Pulmonary exam normal breath sounds clear to auscultation       Cardiovascular negative cardio ROS Normal cardiovascular exam Rhythm:Regular Rate:Normal     Neuro/Psych negative neurological ROS  negative psych ROS   GI/Hepatic negative GI ROS, Neg liver ROS,   Endo/Other  negative endocrine ROS  Renal/GU negative Renal ROS     Musculoskeletal negative musculoskeletal ROS (+)   Abdominal   Peds  Hematology negative hematology ROS (+)   Anesthesia Other Findings Left index finger open wound with tendon involvement  Reproductive/Obstetrics                             Anesthesia Physical Anesthesia Plan  ASA: I  Anesthesia Plan: MAC   Post-op Pain Management:    Induction: Intravenous  PONV Risk Score and Plan: 2 and Ondansetron, Dexamethasone, Propofol infusion, Midazolam and Treatment may vary due to age or medical condition  Airway Management Planned: Simple Face Mask  Additional Equipment:   Intra-op Plan:   Post-operative Plan:   Informed Consent: I have reviewed the patients History and Physical, chart, labs and discussed the procedure including the risks, benefits and alternatives for the proposed anesthesia with the patient or authorized representative who has indicated his/her understanding and acceptance.     Dental advisory given  Plan Discussed with: CRNA  Anesthesia Plan Comments:         Anesthesia Quick Evaluation

## 2020-11-21 NOTE — Discharge Instructions (Signed)
KEEP BANDAGE CLEAN AND DRY CALL OFFICE FOR F/U APPT [phone] rx sent to [facility]  KEEP HAND ELEVATED ABOVE HEART OK TO APPLY ICE TO OPERATIVE AREA CONTACT OFFICE IF ANY WORSENING PAIN OR CONCERNS.

## 2020-11-21 NOTE — Anesthesia Postprocedure Evaluation (Signed)
Anesthesia Post Note  Patient: Maria Hanson  Procedure(s) Performed: #1: Open debridement of skin subcutaneous tissue and bone associated with left index finger open distal phalangeal fracture and DIP joint subluxation #2: Open treatment of left index finger distal phalanx fracture involving the articular portion of the DIP joint requiring internal fixation #3: Open treatment of left index finger volar plate laceration with primary volar plate repair #4: Left index finger flexor digitorum profundus tendon repair zone 1 primary tendon repair (Left Finger) #5: Complex laceration repair left index finger multiple lacerations greater than 6 cm. #6: Radiographs 2 views left index finger (Left Finger)     Patient location during evaluation: PACU Anesthesia Type: MAC Level of consciousness: awake and alert, patient cooperative and oriented Pain management: pain level controlled Vital Signs Assessment: post-procedure vital signs reviewed and stable Respiratory status: spontaneous breathing, nonlabored ventilation and respiratory function stable Cardiovascular status: blood pressure returned to baseline and stable Postop Assessment: no apparent nausea or vomiting and able to ambulate Anesthetic complications: no   No complications documented.  Last Vitals:  Vitals:   11/21/20 1934 11/21/20 1949  BP: 134/77 138/81  Pulse: 61 (!) 57  Resp: 15 17  Temp:  36.5 C  SpO2: 100% 97%    Last Pain:  Vitals:   11/21/20 1949  TempSrc:   PainSc: 5                  Ixchel Duck,E. Juandiego Kolenovic

## 2020-11-21 NOTE — Transfer of Care (Signed)
Immediate Anesthesia Transfer of Care Note  Patient: Maria Hanson  Procedure(s) Performed: left index finger tendon repair and pinning (Left Hand)  Patient Location: PACU  Anesthesia Type:MAC  Level of Consciousness: awake, alert  and oriented  Airway & Oxygen Therapy: Patient Spontanous Breathing  Post-op Assessment: Report given to RN and Post -op Vital signs reviewed and stable  Post vital signs: Reviewed and stable  Last Vitals:  Vitals Value Taken Time  BP    Temp    Pulse    Resp    SpO2      Last Pain:  Vitals:   11/21/20 1313  TempSrc:   PainSc: 7       Patients Stated Pain Goal: 2 (69/45/03 8882)  Complications: No complications documented.

## 2020-11-22 ENCOUNTER — Encounter (HOSPITAL_COMMUNITY): Payer: Self-pay | Admitting: Orthopedic Surgery

## 2021-11-30 IMAGING — DX DG FINGER INDEX 2+V*L*
3 series · 3 of 3 positions shown · non-contrast
Comparison: None.

CLINICAL DATA: Injury

EXAM:
LEFT INDEX FINGER 2+V

[finger ap]
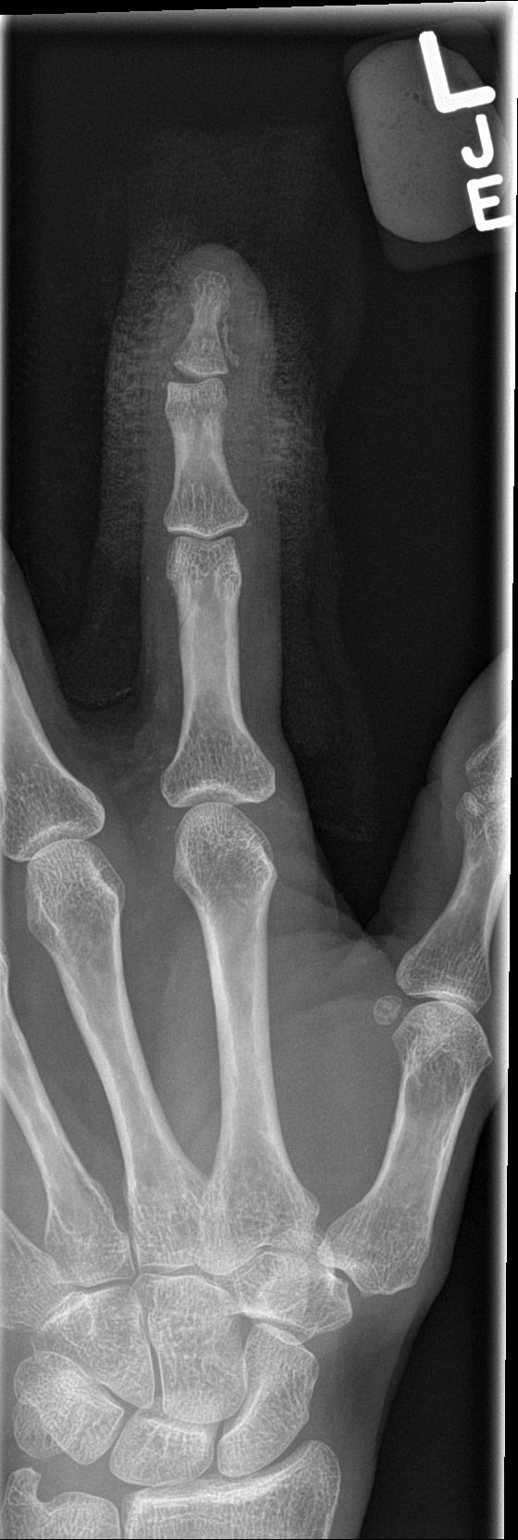

[finger obl]
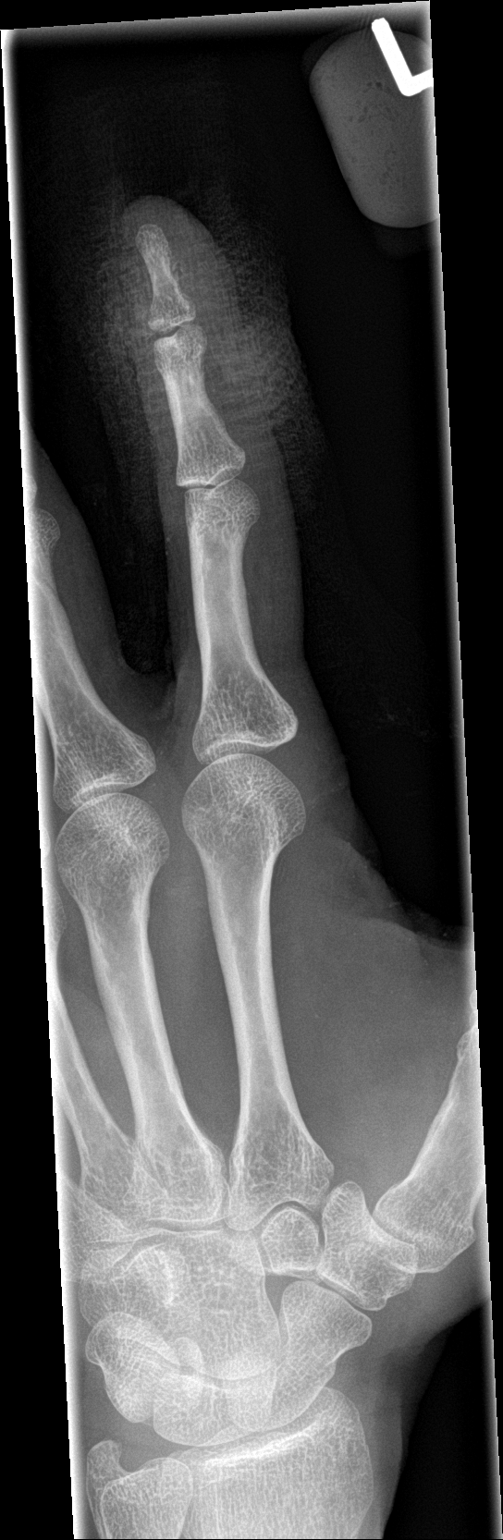

[finger lat]
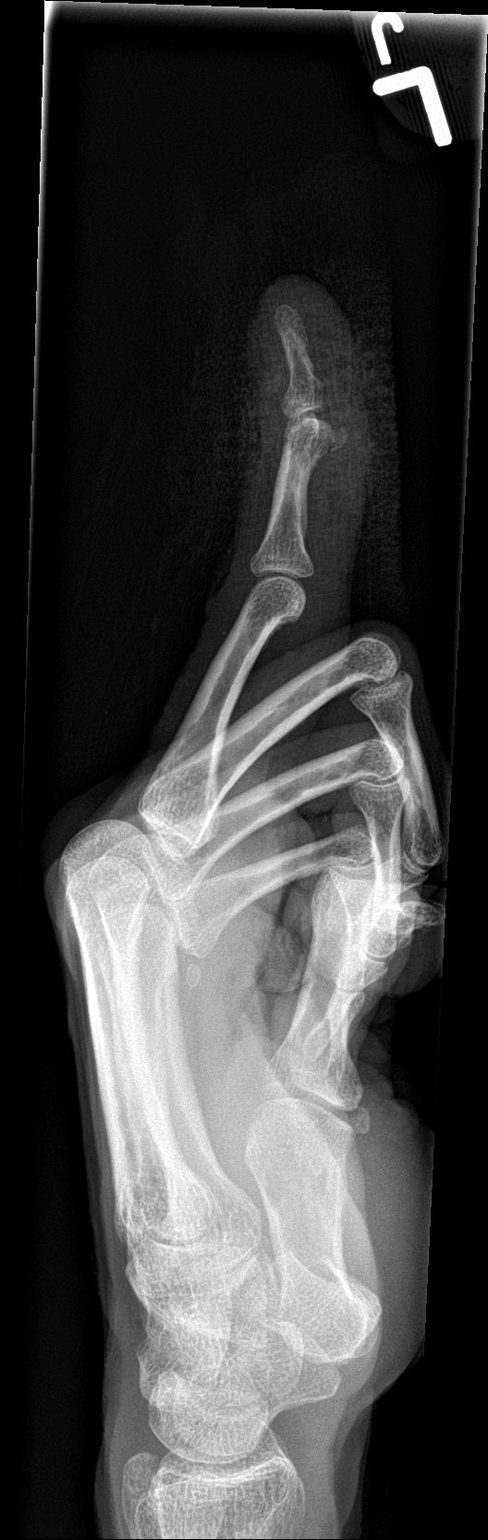

[3 of 3 positions shown; findings below may reference images not displayed]

FINDINGS: Linear hyperdense structure seen adjacent to the radial aspect of
the distal phalanx which could represent a chip fracture. There is
slight radial subluxation of the distal phalanx. There is also a
chip fracture seen on the palmar surface of the middle phalanx the D
IP joint. Overlying soft tissue swelling and laceration is noted.
IMPRESSION: Slight radial subluxation of the distal phalanx with a probable tiny
chip fracture

Chip fracture seen on the palmar surface of the middle phalanx

## 2022-12-07 ENCOUNTER — Ambulatory Visit
Admission: EM | Admit: 2022-12-07 | Discharge: 2022-12-07 | Disposition: A | Discharge status: Home / Self Care | Payer: 59 | Attending: Family Medicine | Admitting: Family Medicine

## 2022-12-07 ENCOUNTER — Encounter: Payer: Self-pay | Admitting: Emergency Medicine

## 2022-12-07 DIAGNOSIS — B370 Candidal stomatitis: Secondary | ICD-10-CM | POA: Insufficient documentation

## 2022-12-07 LAB — POCT RAPID STREP A (OFFICE): Rapid Strep A Screen: NEGATIVE

## 2022-12-07 MED ORDER — CLOTRIMAZOLE 10 MG MT TROC: 10.0000 mg | Freq: Every day | OROMUCOSAL | 0 refills | Status: AC

## 2022-12-07 MED ORDER — FLUCONAZOLE 150 MG PO TABS: ORAL_TABLET | ORAL | 0 refills | Status: DC

## 2022-12-07 NOTE — ED Triage Notes (Signed)
Patient c/o sore throat and fatigue x 1 week.  Afebrile.  Denies any OTC cold mes.

## 2022-12-07 NOTE — ED Provider Notes (Signed)
[facility]    CSN: [id number] Arrival date & time: 12/07/22  4098      History   Chief Complaint Chief Complaint  Patient presents with   Sore Throat    HPI Maria Hanson is a 57 y.o. female.   HPI  Maria Hanson is here for severe sore throat.  Is been bothering her for a week.  She states it is very painful to swallow.  She has no fever or chills.  No runny nose or cough.  Concern for strep.  Is under a terrible amount of emotional stress right now.  Her husband died last week of oral cancer.  He had a prolonged illness.  She states her husband had thrush while he was in the hospital.  Past Medical History:  Diagnosis Date   GERD (gastroesophageal reflux disease)     There are no problems to display for this patient.   Past Surgical History:  Procedure Laterality Date   AUGMENTATION MAMMAPLASTY Bilateral 2006/2007   PERCUTANEOUS PINNING Left 11/21/2020   Procedure: #5: Complex laceration repair left index finger multiple lacerations greater than 6 cm. #6: Radiographs 2 views left index finger;  Surgeon: Bradly Bienenstock, MD;  Location: [facility] OR;  Service: Orthopedics;  Laterality: Left;   PLACEMENT OF BREAST IMPLANTS     TENDON REPAIR Left 11/21/2020   Procedure: #1: Open debridement of skin subcutaneous tissue and bone associated with left index finger open distal phalangeal fracture and DIP joint subluxation #2: Open treatment of left index finger distal phalanx fracture involving the articular portion of the DIP joint requiring internal fixation #3: Open treatment of left index finger volar plate laceration with primary volar plate repair #4:     OB History     Gravida  1   Para  1   Term      Preterm      AB      Living  1      SAB      IAB      Ectopic      Multiple      Live Births               Home Medications    Prior to Admission medications   Medication Sig Start Date End Date Taking? Authorizing Provider  cholecalciferol  (VITAMIN D) 25 MCG (1000 UNIT) tablet Take 1,000 Units by mouth daily.   Yes [provider]  clotrimazole (MYCELEX) 10 MG troche Take 1 tablet (10 mg total) by mouth 5 (five) times daily for 7 days. 12/07/22 12/14/22 Yes Eustace Moore, MD  fluconazole (DIFLUCAN) 150 MG tablet Take one a day for 7 days 12/07/22  Yes Eustace Moore, MD  selenium 50 MCG TABS tablet Take 50 mcg by mouth daily.   Yes [provider]  vitamin C (ASCORBIC ACID) 500 MG tablet Take 500 mg by mouth daily.   Yes [provider]  Zinc 50 MG TABS Take 50 mg by mouth daily.   Yes [provider]    Family History Family History  Problem Relation Age of Onset   Lung cancer Maternal Aunt    Skin cancer Maternal Uncle    Stomach cancer Maternal Uncle     Social History Social History   Tobacco Use   Smoking status: Never   Smokeless tobacco: Never  Vaping Use   Vaping Use: Never used  Substance Use Topics   Alcohol use: No   Drug  use: No     Allergies   Patient has no known allergies.   Review of Systems Review of Systems See HPI  Physical Exam Triage Vital Signs ED Triage Vitals  Enc Vitals Group     BP 12/07/22 0842 (!) 160/89     Pulse Rate 12/07/22 0842 79     Resp 12/07/22 0842 18     Temp 12/07/22 0842 98.2 F (36.8 C)     Temp Source 12/07/22 0842 Oral     SpO2 12/07/22 0842 99 %     Weight 12/07/22 0843 125 lb (56.7 kg)     Height 12/07/22 0843  (1.727 m)     Head Circumference --      Peak Flow --      Pain Score 12/07/22 0843 5     Pain Loc --      Pain Edu? --      Excl. in GC? --    No data found.  Updated Vital Signs BP (!) 160/89 (BP Location: Left Arm)   Pulse 79   Temp 98.2 F (36.8 C) (Oral)   Resp 18   Ht  (1.727 m)   Wt 56.7 kg   LMP 06/08/2017   SpO2 99%   BMI 19.01 kg/m      Physical Exam Constitutional:      General: She is not in acute distress.    Appearance: She is well-developed. She is  ill-appearing.  HENT:     Head: Normocephalic and atraumatic.     Right Ear: Tympanic membrane and ear canal normal.     Left Ear: Tympanic membrane and ear canal normal.     Nose: No congestion.     Mouth/Throat:     Mouth: Mucous membranes are moist. Oral lesions present.     Pharynx: Pharyngeal swelling, oropharyngeal exudate and posterior oropharyngeal erythema present.     Tonsils: Tonsillar exudate present. 0 on the right. 0 on the left.     Comments: Patient has deep erythema both tonsillar pillars, posterior pharynx, and the posterior half of the soft palate covered in white plaques Eyes:     Conjunctiva/sclera: Conjunctivae normal.     Pupils: Pupils are equal, round, and reactive to light.  Cardiovascular:     Rate and Rhythm: Normal rate.  Pulmonary:     Effort: Pulmonary effort is normal. No respiratory distress.  Abdominal:     General: There is no distension.     Palpations: Abdomen is soft.  Musculoskeletal:        General: Normal range of motion.     Cervical back: Normal range of motion.  Lymphadenopathy:     Cervical: No cervical adenopathy.  Skin:    General: Skin is warm and dry.  Neurological:     Mental Status: She is alert.      UC Treatments / Results  Labs (all labs ordered are listed, but only abnormal results are displayed) Labs Reviewed  CULTURE, GROUP A STREP [facility])  POCT RAPID STREP A (OFFICE)    EKG   Radiology No results found.  Procedures Procedures (including critical care time)  Medications Ordered in UC Medications - No data to display  Initial Impression / Assessment and Plan / UC Course  I have reviewed the triage vital signs and the nursing notes.  Pertinent labs & imaging results that were available during my care of the patient were reviewed by me and considered in my medical decision making (see  chart for details).     Final Clinical Impressions(s) / UC Diagnoses   Final diagnoses:  Oral thrush     Discharge  Instructions      Take Diflucan pill 1 a day for 7 days I have also prescribed clotrimazole oral troches.  Slightly dissolved in mouth 5 times a day for 7 days May take Tylenol or ibuprofen for pain Call if not improving in a couple days   ED Prescriptions     Medication Sig Dispense Auth. Provider   fluconazole (DIFLUCAN) 150 MG tablet Take one a day for 7 days 7 tablet Eustace Moore, MD   clotrimazole (MYCELEX) 10 MG troche Take 1 tablet (10 mg total) by mouth 5 (five) times daily for 7 days. 35 tablet Eustace Moore, MD      PDMP not reviewed this encounter.   Eustace Moore, MD 12/07/22 [id number]

## 2022-12-07 NOTE — Discharge Instructions (Signed)
Take Diflucan pill 1 a day for 7 days I have also prescribed clotrimazole oral troches.  Slightly dissolved in mouth 5 times a day for 7 days May take Tylenol or ibuprofen for pain Call if not improving in a couple days

## 2022-12-10 LAB — CULTURE, GROUP A STREP (THRC)

## 2022-12-15 ENCOUNTER — Ambulatory Visit
Admission: EM | Admit: 2022-12-15 | Discharge: 2022-12-15 | Disposition: A | Discharge status: Home / Self Care | Payer: 59 | Attending: Family Medicine | Admitting: Family Medicine

## 2022-12-15 DIAGNOSIS — B370 Candidal stomatitis: Secondary | ICD-10-CM

## 2022-12-15 DIAGNOSIS — K122 Cellulitis and abscess of mouth: Secondary | ICD-10-CM

## 2022-12-15 DIAGNOSIS — J039 Acute tonsillitis, unspecified: Secondary | ICD-10-CM | POA: Diagnosis not present

## 2022-12-15 MED ORDER — AMOXICILLIN-POT CLAVULANATE 875-125 MG PO TABS: 1.0000 | ORAL_TABLET | Freq: Two times a day (BID) | ORAL | 0 refills | Status: AC

## 2022-12-15 MED ORDER — PREDNISONE 50 MG PO TABS: ORAL_TABLET | ORAL | 0 refills | Status: DC

## 2022-12-15 MED ORDER — FLUCONAZOLE 200 MG PO TABS: ORAL_TABLET | ORAL | 0 refills | Status: DC

## 2022-12-15 NOTE — Discharge Instructions (Addendum)
Instructed patient to take medications as directed with food to completion.  Advised patient to take Diflucan, Prednisone with first dose of Augmentin for the next 5 of 10 days.  Encouraged increase daily water intake to 64 ounces per day while taking these medications.  Advised if symptoms worsen and/or unresolved please follow-up with PCP, ENT, or or here for further evaluation.

## 2022-12-15 NOTE — ED Provider Notes (Signed)
[facility]    CSN: [id number] Arrival date & time: 12/15/22  0806      History   Chief Complaint Chief Complaint  Patient presents with   Sore Throat   Otalgia    LT    HPI Maria Hanson is a 57 y.o. female.   HPI Pleasant 57 year old female presents with sore throat for 2 weeks with left ear pain.  Denies fever and is taking no OTC medications. Patient reports was evaluated her last week (12/07/22) with similar symptoms her strep was negative and diagnosed/treated for thrush with fluconazole.  PMH significant for GERD.  Past Medical History:  Diagnosis Date   GERD (gastroesophageal reflux disease)     There are no problems to display for this patient.   Past Surgical History:  Procedure Laterality Date   AUGMENTATION MAMMAPLASTY Bilateral 2006/2007   PERCUTANEOUS PINNING Left 11/21/2020   Procedure: #5: Complex laceration repair left index finger multiple lacerations greater than 6 cm. #6: Radiographs 2 views left index finger;  Surgeon: Bradly Bienenstock, MD;  Location: [facility] OR;  Service: Orthopedics;  Laterality: Left;   PLACEMENT OF BREAST IMPLANTS     TENDON REPAIR Left 11/21/2020   Procedure: #1: Open debridement of skin subcutaneous tissue and bone associated with left index finger open distal phalangeal fracture and DIP joint subluxation #2: Open treatment of left index finger distal phalanx fracture involving the articular portion of the DIP joint requiring internal fixation #3: Open treatment of left index finger volar plate laceration with primary volar plate repair #4:     OB History     Gravida  1   Para  1   Term      Preterm      AB      Living  1      SAB      IAB      Ectopic      Multiple      Live Births               Home Medications    Prior to Admission medications   Medication Sig Start Date End Date Taking? Authorizing Provider  amoxicillin-clavulanate (AUGMENTIN) 875-125 MG tablet Take 1 tablet by mouth 2 (two)  times daily for 10 days. 12/15/22 12/25/22 Yes Trevor Iha, FNP  fluconazole (DIFLUCAN) 200 MG tablet Take 1 tab p.o. daily x 5 days 12/15/22  Yes Trevor Iha, FNP  predniSONE (DELTASONE) 50 MG tablet Take 1 tab p.o. daily for 5 days. 12/15/22  Yes Trevor Iha, FNP  cholecalciferol (VITAMIN D) 25 MCG (1000 UNIT) tablet Take 1,000 Units by mouth daily.    [provider]  selenium 50 MCG TABS tablet Take 50 mcg by mouth daily.    [provider]  vitamin C (ASCORBIC ACID) 500 MG tablet Take 500 mg by mouth daily.    [provider]  Zinc 50 MG TABS Take 50 mg by mouth daily.    [provider]    Family History Family History  Problem Relation Age of Onset   Lung cancer Maternal Aunt    Skin cancer Maternal Uncle    Stomach cancer Maternal Uncle     Social History Social History   Tobacco Use   Smoking status: Never   Smokeless tobacco: Never  Vaping Use   Vaping Use: Never used  Substance Use Topics   Alcohol use: No   Drug use: No     Allergies   Patient  has no known allergies.   Review of Systems Review of Systems  HENT:  Positive for ear pain and sore throat.   All other systems reviewed and are negative.    Physical Exam Triage Vital Signs ED Triage Vitals  Enc Vitals Group     BP 12/15/22 0815 (!) 138/92     Pulse Rate 12/15/22 0815 85     Resp 12/15/22 0815 18     Temp 12/15/22 0815 98 F (36.7 C)     Temp Source 12/15/22 0815 Oral     SpO2 12/15/22 0815 100 %     Weight --      Height --      Head Circumference --      Peak Flow --      Pain Score 12/15/22 0816 3     Pain Loc --      Pain Edu? --      Excl. in GC? --    No data found.  Updated Vital Signs BP (!) 138/92 (BP Location: Right Arm)   Pulse 85   Temp 98 F (36.7 C) (Oral)   Resp 18   LMP 06/08/2017   SpO2 100%      Physical Exam Vitals and nursing note reviewed.  Constitutional:      General: She is not in acute distress.     Appearance: Normal appearance. She is well-developed and normal weight. She is not ill-appearing.  HENT:     Head: Normocephalic and atraumatic.     Right Ear: Tympanic membrane, ear canal and external ear normal.     Left Ear: Tympanic membrane, ear canal and external ear normal.     Mouth/Throat:     Mouth: Mucous membranes are moist.     Pharynx: Uvula midline. Pharyngeal swelling, oropharyngeal exudate, posterior oropharyngeal erythema and uvula swelling present.     Tonsils: 3+ on the right. 3+ on the left.     Comments: Tongue (lateral/right posterior aspects)/palatoglossal arch: thin/thick white creamy patches noted Eyes:     Extraocular Movements: Extraocular movements intact.     Conjunctiva/sclera: Conjunctivae normal.     Pupils: Pupils are equal, round, and reactive to light.  Cardiovascular:     Rate and Rhythm: Normal rate and regular rhythm.     Pulses: Normal pulses.     Heart sounds: Normal heart sounds. No murmur heard. Pulmonary:     Effort: Pulmonary effort is normal.     Breath sounds: Normal breath sounds. No wheezing, rhonchi or rales.  Musculoskeletal:        General: Normal range of motion.     Cervical back: Normal range of motion and neck supple.  Skin:    General: Skin is warm and dry.  Neurological:     General: No focal deficit present.     Mental Status: She is alert and oriented to person, place, and time. Mental status is at baseline.  Psychiatric:        Mood and Affect: Mood normal.        Behavior: Behavior normal.        Thought Content: Thought content normal.      UC Treatments / Results  Labs (all labs ordered are listed, but only abnormal results are displayed) Labs Reviewed - No data to display  EKG   Radiology No results found.  Procedures Procedures (including critical care time)  Medications Ordered in UC Medications - No data to display  Initial Impression / Assessment and  Plan / UC Course  I have reviewed the triage  vital signs and the nursing notes.  Pertinent labs & imaging results that were available during my care of the patient were reviewed by me and considered in my medical decision making (see chart for details).     MDM: 1.  Uvulitis-R Rx'd Augmentin 825/125 mg twice daily x 10 days; 2.  Acute tonsillitis, unspecified etiology-same as 1 Augmentin plus prednisone 50 mg daily x 5 days; 3.  Oropharyngeal candidiasis-Rx'd Diflucan 200 mg daily x 5 days. Instructed patient to take medications as directed with food to completion.  Advised patient to take Diflucan, Prednisone with first dose of Augmentin for the next 5 of 10 days.  Encouraged increase daily water intake to 64 ounces per day while taking these medications.  Advised if symptoms worsen and/or unresolved please follow-up with PCP, ENT, or or here for further evaluation.  Final Clinical Impressions(s) / UC Diagnoses   Final diagnoses:  Uvulitis  Acute tonsillitis, unspecified etiology  Oral pharyngeal candidiasis     Discharge Instructions      Instructed patient to take medications as directed with food to completion.  Advised patient to take Diflucan, Prednisone with first dose of Augmentin for the next 5 of 10 days.  Encouraged increase daily water intake to 64 ounces per day while taking these medications.  Advised if symptoms worsen and/or unresolved please follow-up with PCP, ENT, or or here for further evaluation.     ED Prescriptions     Medication Sig Dispense Auth. Provider   fluconazole (DIFLUCAN) 200 MG tablet Take 1 tab p.o. daily x 5 days 7 tablet Trevor Iha, FNP   amoxicillin-clavulanate (AUGMENTIN) 875-125 MG tablet Take 1 tablet by mouth 2 (two) times daily for 10 days. 20 tablet Trevor Iha, FNP   predniSONE (DELTASONE) 50 MG tablet Take 1 tab p.o. daily for 5 days. 5 tablet Trevor Iha, FNP      PDMP not reviewed this encounter.   Trevor Iha, FNP 12/15/22 [phone]

## 2022-12-15 NOTE — ED Triage Notes (Signed)
Pt c/o sore throat x 2 weeks. Also LT ear pain. Denies fever. No OTC meds tried. Was seen last week for same sxs, strep neg. Dx with thrush. Tx with fluconazole.

## 2022-12-23 ENCOUNTER — Ambulatory Visit: Payer: 59 | Admitting: Internal Medicine

## 2023-01-07 ENCOUNTER — Other Ambulatory Visit: Payer: Self-pay

## 2023-01-07 ENCOUNTER — Emergency Department (HOSPITAL_BASED_OUTPATIENT_CLINIC_OR_DEPARTMENT_OTHER)
Admission: EM | Admit: 2023-01-07 | Discharge: 2023-01-07 | Disposition: A | Discharge status: Home / Self Care | Payer: 59 | Attending: Emergency Medicine | Admitting: Emergency Medicine

## 2023-01-07 ENCOUNTER — Encounter (HOSPITAL_BASED_OUTPATIENT_CLINIC_OR_DEPARTMENT_OTHER): Payer: Self-pay | Admitting: Emergency Medicine

## 2023-01-07 DIAGNOSIS — J029 Acute pharyngitis, unspecified: Secondary | ICD-10-CM | POA: Insufficient documentation

## 2023-01-07 LAB — GROUP A STREP BY PCR: Group A Strep by PCR: NOT DETECTED

## 2023-01-07 MED ORDER — DEXAMETHASONE 4 MG PO TABS
10.0000 mg | ORAL_TABLET | Freq: Once | ORAL | Status: AC
  Administered 2023-01-07: 10 mg via ORAL
  Filled 2023-01-07: qty 3

## 2023-01-07 MED ORDER — KETOROLAC TROMETHAMINE 15 MG/ML IJ SOLN
15.0000 mg | Freq: Once | INTRAMUSCULAR | Status: AC
  Administered 2023-01-07: 15 mg via INTRAMUSCULAR
  Filled 2023-01-07: qty 1

## 2023-01-07 NOTE — ED Provider Notes (Signed)
[place] [facility] Provider Note   CSN: [id number] Arrival date & time: 01/07/23  1945     History Chief Complaint  Patient presents with   Maria Hanson    HPI Maria Hanson is a 57 y.o. female presenting for chief complaint of pharyngitis.  States that she has had a sore throat for the last 12 hours. Was seen last month with similar diagnosed with "thrush" she denies fevers or chills, nausea vomiting, syncope shortness of breath.  She is otherwise ambulatory tolerating p.o. intake. States that she has had some unfortunate revelations about a cheating significant other recently.  Patient's recorded medical, surgical, social, medication list and allergies were reviewed in the Snapshot window as part of the initial history.   Review of Systems   Review of Systems  Constitutional:  Negative for chills and fever.  HENT:  Positive for sore throat. Negative for ear pain.   Eyes:  Negative for pain and visual disturbance.  Respiratory:  Negative for cough and shortness of breath.   Cardiovascular:  Negative for chest pain and palpitations.  Gastrointestinal:  Negative for abdominal pain and vomiting.  Genitourinary:  Negative for dysuria and hematuria.  Musculoskeletal:  Negative for arthralgias and back pain.  Skin:  Negative for color change and rash.  Neurological:  Negative for seizures and syncope.  All other systems reviewed and are negative.   Physical Exam Updated Vital Signs BP (!) 152/92 (BP Location: Left Arm)   Pulse 70   Temp 98.3 F (36.8 C) (Oral)   Resp 16   Ht 5\' 8"  (1.727 m)   Wt 56.7 kg   LMP 06/08/2017   SpO2 99%   BMI 19.01 kg/m  Physical Exam Vitals and nursing note reviewed.  Constitutional:      General: She is not in acute distress.    Appearance: She is well-developed.  HENT:     Head: Normocephalic and atraumatic.  Eyes:     Conjunctiva/sclera: Conjunctivae normal.  Cardiovascular:     Rate and Rhythm:  Normal rate and regular rhythm.     Heart sounds: No murmur heard. Pulmonary:     Effort: Pulmonary effort is normal. No respiratory distress.     Breath sounds: Normal breath sounds.  Abdominal:     General: There is no distension.     Palpations: Abdomen is soft.     Tenderness: There is no abdominal tenderness. There is no right CVA tenderness or left CVA tenderness.  Musculoskeletal:        General: No swelling or tenderness. Normal range of motion.     Cervical back: Neck supple.  Skin:    General: Skin is warm and dry.  Neurological:     General: No focal deficit present.     Mental Status: She is alert and oriented to person, place, and time. Mental status is at baseline.     Cranial Nerves: No cranial nerve deficit.      ED Course/ Medical Decision Making/ A&P    Procedures Procedures   Medications Ordered in ED Medications  ketorolac (TORADOL) 15 MG/ML injection 15 mg (15 mg Intramuscular Given 01/07/23 2147)  dexamethasone (DECADRON) tablet 10 mg (10 mg Oral Given 01/07/23 2146)  Medical Decision Making:   Maria Hanson is a 57 y.o. female who presented to the ED today with subjective fever, cough, congestion detailed above.    Patient placed on continuous vitals and telemetry monitoring while in ED which was  reviewed periodically.   Complete initial physical exam performed, notably the patient  was hemodynamically stable in no acute distress.  Posterior oropharynx illuminated and without obvious swelling or deformity.  Patient is without neck stiffness.    Reviewed and confirmed nursing documentation for past medical history, family history, social history.    Initial Assessment:   With the patient's presentation of fever cough congestion, most likely diagnosis is developing viral upper respiratory infection. Other diagnoses were considered including (but not limited to) peritonsillar abscess, retropharyngeal abscess, pneumonia. These are considered less likely due  to history of present illness and physical exam findings.   This is most consistent with an acute life/limb threatening illness complicated by underlying chronic conditions. Considered meningitis, however patient's symptoms, vital signs, physical exam findings including lack of meningismus seem grossly less consistent at this time. Initial Plan:  Empiric treatment with antipyretics including acetaminophen in ambulatory setting Will augment with dose of ketorolac in ED  As patient has sore throat, CENTOR Score dictates the following evaluation: Rapid strep Additionally will treat sore throat with dexamethasone dose Objective evaluation as below reviewed   Initial Study Results:   Laboratory  All laboratory results reviewed without evidence of clinically relevant pathology.    Final Assessment and Plan:   On reassessment, patient is ambulatory tolerating p.o. intake in no acute distress.   Discussed with patient potential need for HIV testing.  Her presentation today is not consistent with thrush but she has required antifungals twice in the last month for pharyngitis symptoms.  She should be worked up for underlying immunosuppression given her lack of any other acute disease processes.  She has declined to do this tonight states that she would rather follow-up with her primary care provider for this decision making. Patient is currently stable for outpatient care and management with no indication for hospitalization or transfer at this time.  Discussed all findings with patient expressed understanding.  Disposition:  Based on the above findings, I believe patient is stable for discharge.    Patient/family educated about specific return precautions for given chief complaint and symptoms.  Patient/family educated about follow-up with PCP.     Patient/family expressed understanding of return precautions and need for follow-up. Patient spoken to regarding all imaging and laboratory results and  appropriate follow up for these results. All education provided in verbal form with additional information in written form. Time was allowed for answering of patient questions. Patient discharged.    Emergency Department Medication Summary:   Medications  ketorolac (TORADOL) 15 MG/ML injection 15 mg (15 mg Intramuscular Given 01/07/23 2147)  dexamethasone (DECADRON) tablet 10 mg (10 mg Oral Given 01/07/23 2146)           Clinical Impression:  1. Pharyngitis, unspecified etiology      Discharge   Final Clinical Impression(s) / ED Diagnoses Final diagnoses:  Pharyngitis, unspecified etiology    Rx / DC Orders ED Discharge Orders     None         Glyn Ade, MD 01/07/23 2248

## 2023-01-07 NOTE — ED Triage Notes (Signed)
Patient with throat pain over the last 3 days and notes white patches to the back of the tongue. For the last month patient has been dealing with recurrent thrush. Patient was previously treated with abx and antifungals and it resolved temporarily however she feels its returned. Patient additionally voiced concern that husband recently passed away 06/07/24and had hx of oral cancer and HPV. Patient concerned for this as well. Patient did have blood work done and awaiting pap smear at GYN office. Denies difficulty swallowing or SHOB. Aox4.

## 2023-01-07 NOTE — ED Notes (Signed)
Cannot discharge, registration in chart.   

## 2023-01-13 ENCOUNTER — Encounter: Payer: Self-pay | Admitting: Family Medicine

## 2023-01-13 ENCOUNTER — Ambulatory Visit (INDEPENDENT_AMBULATORY_CARE_PROVIDER_SITE_OTHER): Payer: 59 | Admitting: Family Medicine

## 2023-01-13 VITALS — BP 162/87 | HR 98 | Ht 68.0 in | Wt 126.0 lb

## 2023-01-13 DIAGNOSIS — Z202 Contact with and (suspected) exposure to infections with a predominantly sexual mode of transmission: Secondary | ICD-10-CM

## 2023-01-13 NOTE — Progress Notes (Signed)
Established patient visit   Patient: Maria Hanson   DOB: 1965/11/26   57 y.o. Female  MRN: [id number] Visit Date: 01/13/2023  Today's healthcare provider: Charlton Amor, DO   Chief Complaint  Patient presents with   Establish Care    SUBJECTIVE    Chief Complaint  Patient presents with   Establish Care   HPI  Pt presents to establish care. She recently lost her husband in March and found out that he was cheating on her. She has had a sore throat.    She found an online service where she can swab herself and was positive for chlamydia in the anal region and treated with azithromycin.  Review of Systems  Constitutional:  Negative for activity change, fatigue and fever.  Respiratory:  Negative for cough and shortness of breath.   Cardiovascular:  Negative for chest pain.  Gastrointestinal:  Negative for abdominal pain.  Genitourinary:  Negative for difficulty urinating.       Current Meds  Medication Sig   cholecalciferol (VITAMIN D) 25 MCG (1000 UNIT) tablet Take 1,000 Units by mouth daily.   fluconazole (DIFLUCAN) 200 MG tablet Take 1 tab p.o. daily x 5 days   predniSONE (DELTASONE) 50 MG tablet Take 1 tab p.o. daily for 5 days.   selenium 50 MCG TABS tablet Take 50 mcg by mouth daily.   vitamin C (ASCORBIC ACID) 500 MG tablet Take 500 mg by mouth daily.   Zinc 50 MG TABS Take 50 mg by mouth daily.    OBJECTIVE    BP (!) 162/87   Pulse 98   Ht 5\' 8"  (1.727 m)   Wt 126 lb (57.2 kg)   LMP 06/08/2017   SpO2 98%   BMI 19.16 kg/m   Physical Exam Vitals and nursing note reviewed.  Constitutional:      General: She is not in acute distress.    Appearance: Normal appearance.  HENT:     Head: Normocephalic and atraumatic.     Right Ear: External ear normal.     Left Ear: External ear normal.     Nose: Nose normal.  Eyes:     Conjunctiva/sclera: Conjunctivae normal.  Cardiovascular:     Rate and Rhythm: Normal rate and regular rhythm.  Pulmonary:      Effort: Pulmonary effort is normal.     Breath sounds: Normal breath sounds.  Neurological:     General: No focal deficit present.     Mental Status: She is alert and oriented to person, place, and time.  Psychiatric:        Mood and Affect: Mood normal.        Behavior: Behavior normal.        Thought Content: Thought content normal.        Judgment: Judgment normal.        ASSESSMENT & PLAN    Problem List Items Addressed This Visit       Other   Exposure to STD - Primary    - will order std panel  - will have pt self swab for wet prep as well anal swab for chlamydia. I am repeating anal swab since patient was being treated with azithromycin and doxycycline is the mainstay treatment. I want to make sure infection has cleared.  - she said she sees a therapist and has been doing well with stress      Relevant Orders   WET PREP FOR TRICH, YEAST, CLUE  Chlamydia/Neisseria Gonorrhoeae RNA,TMA,Urogenital   HIV antibody (with reflex)   RPR   GC/CT Probe, Amp (Throat)   Hepatitis panel, acute   Chlamydia/Neisseria Gonorrhoeae RNA,TMA,Urogenital    No follow-ups on file.      No orders of the defined types were placed in this encounter.   Orders Placed This Encounter  Procedures   WET PREP FOR TRICH, YEAST, CLUE   Chlamydia/Neisseria Gonorrhoeae RNA,TMA,Urogenital   GC/CT Probe, Amp (Throat)   Chlamydia/Neisseria Gonorrhoeae RNA,TMA,Urogenital   HIV antibody (with reflex)   RPR   Hepatitis panel, acute    Order Specific Question:   Release to patient    Answer:   Immediate     Charlton Amor, DO  [facility] at [facility] [phone] (phone) [phone] (fax)  [facility]

## 2023-01-13 NOTE — Assessment & Plan Note (Addendum)
-   will order std panel  - will have pt self swab for wet prep as well anal swab for chlamydia. I am repeating anal swab since patient was being treated with azithromycin and doxycycline is the mainstay treatment. I want to make sure infection has cleared.  - she said she sees a therapist and has been doing well with stress

## 2023-01-15 LAB — CHLAMYDIA/NEISSERIA GONORRHOEAE RNA,TMA,UROGENTIAL
C. trachomatis RNA, TMA: NOT DETECTED
N. gonorrhoeae RNA, TMA: NOT DETECTED

## 2023-01-18 LAB — HEPATITIS PANEL, ACUTE

## 2023-01-18 LAB — WET PREP FOR TRICH, YEAST, CLUE
MICRO NUMBER:: 15014597
Specimen Quality: ADEQUATE

## 2023-01-18 LAB — CHLAMYDIA/NEISSERIA GONORRHOEAE RNA,TMA,UROGENTIAL
C. trachomatis RNA, TMA: NOT DETECTED
N. gonorrhoeae RNA, TMA: NOT DETECTED

## 2023-01-18 LAB — HIV ANTIBODY (ROUTINE TESTING W REFLEX)

## 2023-01-18 LAB — GC/CHLAMYDIA PROBE, AMP (THROAT)

## 2023-01-18 LAB — RPR

## 2023-01-21 ENCOUNTER — Ambulatory Visit (INDEPENDENT_AMBULATORY_CARE_PROVIDER_SITE_OTHER): Payer: 59 | Admitting: Obstetrics & Gynecology

## 2023-01-21 ENCOUNTER — Other Ambulatory Visit (HOSPITAL_COMMUNITY)
Admission: RE | Admit: 2023-01-21 | Discharge: 2023-01-21 | Disposition: A | Discharge status: Home / Self Care | Payer: 59 | Source: Ambulatory Visit | Attending: Obstetrics & Gynecology | Admitting: Obstetrics & Gynecology

## 2023-01-21 ENCOUNTER — Encounter: Payer: Self-pay | Admitting: Obstetrics & Gynecology

## 2023-01-21 VITALS — BP 136/88 | HR 77 | Resp 16 | Ht 68.0 in | Wt 129.0 lb

## 2023-01-21 DIAGNOSIS — Z01419 Encounter for gynecological examination (general) (routine) without abnormal findings: Secondary | ICD-10-CM

## 2023-01-21 DIAGNOSIS — Z113 Encounter for screening for infections with a predominantly sexual mode of transmission: Secondary | ICD-10-CM | POA: Diagnosis not present

## 2023-01-21 DIAGNOSIS — Z1231 Encounter for screening mammogram for malignant neoplasm of breast: Secondary | ICD-10-CM | POA: Diagnosis not present

## 2023-01-21 NOTE — Progress Notes (Signed)
GYNECOLOGY ANNUAL PREVENTATIVE CARE ENCOUNTER NOTE  History:     Maria Hanson is a 57 y.o. G1P1 female here for a routine annual gynecologic exam.  Current complaints: desires serum STI screen, had negative vaginal and throat swabs at separate encounters.    Denies abnormal vaginal bleeding, discharge, pelvic pain, problems with intercourse or other gynecologic concerns.    Gynecologic History Patient's last menstrual period was 06/08/2017. Contraception: post menopausal status Last Pap: 2018. Result was normal with negative HPV Last Mammogram: 10/19/2019.  Result was normal Last Cologard: 2022.  Result was normal  Obstetric History OB History  Gravida Para Term Preterm AB Living  1 1       1   SAB IAB Ectopic Multiple Live Births               # Outcome Date GA Lbr Len/2nd Weight Sex Delivery Anes PTL Lv  1 Para             Past Medical History:  Diagnosis Date   GERD (gastroesophageal reflux disease)     Past Surgical History:  Procedure Laterality Date   AUGMENTATION MAMMAPLASTY Bilateral 2006/2007   PERCUTANEOUS PINNING Left 11/21/2020   Procedure: #5: Complex laceration repair left index finger multiple lacerations greater than 6 cm. #6: Radiographs 2 views left index finger;  Surgeon: Bradly Bienenstock, MD;  Location: [facility] OR;  Service: Orthopedics;  Laterality: Left;   PLACEMENT OF BREAST IMPLANTS     TENDON REPAIR Left 11/21/2020   Procedure: #1: Open debridement of skin subcutaneous tissue and bone associated with left index finger open distal phalangeal fracture and DIP joint subluxation #2: Open treatment of left index finger distal phalanx fracture involving the articular portion of the DIP joint requiring internal fixation #3: Open treatment of left index finger volar plate laceration with primary volar plate repair #4:     Current Outpatient Medications on File Prior to Visit  Medication Sig Dispense Refill   cholecalciferol (VITAMIN D) 25 MCG (1000 UNIT) tablet Take  1,000 Units by mouth daily.     selenium 50 MCG TABS tablet Take 50 mcg by mouth daily.     vitamin C (ASCORBIC ACID) 500 MG tablet Take 500 mg by mouth daily.     Zinc 50 MG TABS Take 50 mg by mouth daily.     No current facility-administered medications on file prior to visit.    No Known Allergies  Social History:  reports that she has never smoked. She has never used smokeless tobacco. She reports that she does not drink alcohol and does not use drugs.  Family History  Problem Relation Age of Onset   Lung cancer Maternal Aunt    Skin cancer Maternal Uncle    Stomach cancer Maternal Uncle     The following portions of the patient's history were reviewed and updated as appropriate: allergies, current medications, past family history, past medical history, past social history, past surgical history and problem list.  Review of Systems Pertinent items noted in HPI and remainder of comprehensive ROS otherwise negative.  Physical Exam:  BP 136/88   Pulse 77   Resp 16   Ht 5\' 8"  (1.727 m)   Wt 129 lb (58.5 kg)   LMP 06/08/2017   BMI 19.61 kg/m  CONSTITUTIONAL: Well-developed, well-nourished female in no acute distress.  HENT:  Normocephalic, atraumatic, External right and left ear normal.  EYES: Conjunctivae and EOM are normal. Pupils are equal, round, and reactive to light.  No scleral icterus.  NECK: Normal range of motion, supple, no masses.  Normal thyroid.  SKIN: Skin is warm and dry. No rash noted. Not diaphoretic. No erythema. No pallor. MUSCULOSKELETAL: Normal range of motion. No tenderness.  No cyanosis, clubbing, or edema. NEUROLOGIC: Alert and oriented to person, place, and time. Normal reflexes, muscle tone coordination.  PSYCHIATRIC: Normal mood and affect. Normal behavior. Normal judgment and thought content. CARDIOVASCULAR: Normal heart rate noted, regular rhythm RESPIRATORY: Clear to auscultation bilaterally. Effort and breath sounds normal, no problems with  respiration noted. BREASTS: Symmetric in size. Implants in place bilaterally. No masses, tenderness, skin changes, nipple drainage, or lymphadenopathy bilaterally. Performed in the presence of a chaperone. ABDOMEN: Soft, no distention noted.  No tenderness, rebound or guarding.  PELVIC: Normal appearing external genitalia and urethral meatus; normal appearing vaginal mucosa and cervix.  No abnormal vaginal discharge noted.  Pap smear obtained.  Normal uterine size, no other palpable masses, no uterine or adnexal tenderness.  Performed in the presence of a chaperone.   Assessment and Plan:     1. Routine screening for STI (sexually transmitted infection) STI screen done as requested, will follow up results and manage accordingly. - RPR+HBsAg+HCVAb+HIV  2. Breast cancer screening by mammogram Mammogram scheduled - MM 3D SCREENING MAMMOGRAM BILATERAL BREAST W/IMPLANT; Future  3. Well woman exam with routine gynecological exam - Cytology - PAP( [place]) Will follow up results of pap smear and manage accordingly. Colon cancer screening is up to date. Routine preventative health maintenance measures emphasized. Please refer to After Visit Summary for other counseling recommendations.      Jaynie Collins, MD, FACOG Obstetrician & Gynecologist, [facility] for [employer], [facility]

## 2023-01-25 ENCOUNTER — Ambulatory Visit (INDEPENDENT_AMBULATORY_CARE_PROVIDER_SITE_OTHER): Payer: 59

## 2023-01-25 DIAGNOSIS — Z23 Encounter for immunization: Secondary | ICD-10-CM | POA: Diagnosis not present

## 2023-01-25 NOTE — Progress Notes (Signed)
Pt here for first Gardasil injection. Injection given and tolerated well. Pt will return to office for next injection.

## 2023-01-26 LAB — RPR+HBSAG+HCVAB+...
HIV Screen 4th Generation wRfx: NONREACTIVE
Hep C Virus Ab: NONREACTIVE
Hepatitis B Surface Ag: NEGATIVE
RPR Ser Ql: NONREACTIVE

## 2023-01-27 LAB — CYTOLOGY - PAP
Comment: NEGATIVE
Diagnosis: NEGATIVE
Diagnosis: REACTIVE
High risk HPV: NEGATIVE

## 2023-02-01 ENCOUNTER — Encounter: Payer: Self-pay | Admitting: Obstetrics & Gynecology

## 2023-02-01 ENCOUNTER — Telehealth: Payer: Self-pay

## 2023-02-01 ENCOUNTER — Other Ambulatory Visit: Payer: Self-pay | Admitting: Family Medicine

## 2023-02-01 DIAGNOSIS — Z113 Encounter for screening for infections with a predominantly sexual mode of transmission: Secondary | ICD-10-CM

## 2023-02-01 NOTE — Telephone Encounter (Deleted)
LM for pt to return call. [name], CMA

## 2023-02-01 NOTE — Telephone Encounter (Deleted)
-----   Message from Charlton Amor, DO sent at 02/01/2023  1:07 PM EDT ----- Please call patient  Let her know for some reason the HIV and hepatitis testing did not result on the blood draw. I have ordered the blood tests again and would ask you to come back to ensure that we get appropriate testing. I have placed repeat orders and she can come in anytime from 8-4:30pm to have it drawn.

## 2023-02-01 NOTE — Telephone Encounter (Deleted)
-----   Message from Erika S Wachs, DO sent at 02/01/2023  1:07 PM EDT ----- Please call patient  Let her know for some reason the HIV and hepatitis testing did not result on the blood draw. I have ordered the blood tests again and would ask you to come back to ensure that we get appropriate testing. I have placed repeat orders and she can come in anytime from 8-4:30pm to have it drawn.  

## 2023-02-03 NOTE — Telephone Encounter (Signed)
Opened in error Maria Hanson, CMA

## 2023-03-17 ENCOUNTER — Ambulatory Visit: Payer: 59

## 2023-03-29 ENCOUNTER — Ambulatory Visit (INDEPENDENT_AMBULATORY_CARE_PROVIDER_SITE_OTHER): Payer: 59

## 2023-03-29 DIAGNOSIS — Z23 Encounter for immunization: Secondary | ICD-10-CM

## 2023-03-29 NOTE — Progress Notes (Signed)
Pt is here for second Gardasil injection. Injection given and tolerated well. Pt is scheduled to come back for third injection.

## 2023-03-31 ENCOUNTER — Other Ambulatory Visit: Payer: Self-pay

## 2023-03-31 ENCOUNTER — Emergency Department (HOSPITAL_BASED_OUTPATIENT_CLINIC_OR_DEPARTMENT_OTHER)
Admission: EM | Admit: 2023-03-31 | Discharge: 2023-04-01 | Disposition: A | Discharge status: Home / Self Care | Payer: 59 | Attending: Emergency Medicine | Admitting: Emergency Medicine

## 2023-03-31 ENCOUNTER — Encounter (HOSPITAL_BASED_OUTPATIENT_CLINIC_OR_DEPARTMENT_OTHER): Payer: Self-pay

## 2023-03-31 DIAGNOSIS — I10 Essential (primary) hypertension: Secondary | ICD-10-CM | POA: Diagnosis not present

## 2023-03-31 DIAGNOSIS — R030 Elevated blood-pressure reading, without diagnosis of hypertension: Secondary | ICD-10-CM | POA: Diagnosis present

## 2023-03-31 LAB — COMPREHENSIVE METABOLIC PANEL
ALT: 27 U/L (ref 0–44)
AST: 25 U/L (ref 15–41)
Albumin: 3.9 g/dL (ref 3.5–5.0)
Alkaline Phosphatase: 83 U/L (ref 38–126)
Anion gap: 10 (ref 5–15)
BUN: 19 mg/dL (ref 6–20)
CO2: 24 mmol/L (ref 22–32)
Calcium: 8.6 mg/dL — ABNORMAL LOW (ref 8.9–10.3)
Chloride: 101 mmol/L (ref 98–111)
Creatinine, Ser: 0.62 mg/dL (ref 0.44–1.00)
GFR, Estimated: >60 mL/min (ref >60)
Glucose, Bld: 103 mg/dL — ABNORMAL HIGH (ref 70–99)
Potassium: 3.1 mmol/L — ABNORMAL LOW (ref 3.5–5.1)
Sodium: 135 mmol/L (ref 135–145)
Total Bilirubin: 0.4 mg/dL (ref 0.3–1.2)
Total Protein: 6.8 g/dL (ref 6.5–8.1)

## 2023-03-31 LAB — CBC WITH DIFFERENTIAL/PLATELET
Abs Immature Granulocytes: 0.01 10*3/uL (ref 0.00–0.07)
Basophils Absolute: 0 10*3/uL (ref 0.0–0.1)
Basophils Relative: 1 %
Eosinophils Absolute: 0.2 10*3/uL (ref 0.0–0.5)
Eosinophils Relative: 3 %
HCT: 40.2 % (ref 36.0–46.0)
Hemoglobin: 13.3 g/dL (ref 12.0–15.0)
Immature Granulocytes: 0 %
Lymphocytes Relative: 32 %
Lymphs Abs: 1.4 10*3/uL (ref 0.7–4.0)
MCH: 29.4 pg (ref 26.0–34.0)
MCHC: 33.1 g/dL (ref 30.0–36.0)
MCV: 88.9 fL (ref 80.0–100.0)
Monocytes Absolute: 0.5 10*3/uL (ref 0.1–1.0)
Monocytes Relative: 10 %
Neutro Abs: 2.3 10*3/uL (ref 1.7–7.7)
Neutrophils Relative %: 54 %
Platelets: 276 10*3/uL (ref 150–400)
RBC: 4.52 MIL/uL (ref 3.87–5.11)
RDW: 13.5 % (ref 11.5–15.5)
WBC: 4.4 10*3/uL (ref 4.0–10.5)
nRBC: 0 % (ref 0.0–0.2)

## 2023-03-31 MED ORDER — HYDRALAZINE HCL 25 MG PO TABS
25.0000 mg | ORAL_TABLET | Freq: Once | ORAL | Status: AC
  Administered 2023-03-31: 25 mg via ORAL
  Filled 2023-03-31: qty 1

## 2023-03-31 NOTE — ED Provider Notes (Signed)
[facility]PT [facility] Emergency Department Provider Note MRN:  [id number]  Arrival date & time: 04/01/23     Chief Complaint   Hypertension   History of Present Illness   Maria Hanson is a 57 y.o. year-old female with a history of GERD presenting to the ED with chief complaint of hypertension.  Patient noticed that her eyes were a bit bloodshot today after work, right greater than left.  Denies vision loss or eye pain.  Decided to check her blood pressure.  Was elevated.  Tried to relax and then check it again, but still elevated.  Here for evaluation.  No chest pain or shortness of breath, no numbness or weakness to the arms or legs.  Review of Systems  A thorough review of systems was obtained and all systems are negative except as noted in the HPI and PMH.   Patient's Health History    Past Medical History:  Diagnosis Date   GERD (gastroesophageal reflux disease)     Past Surgical History:  Procedure Laterality Date   AUGMENTATION MAMMAPLASTY Bilateral 2006/2007   PERCUTANEOUS PINNING Left 11/21/2020   Procedure: #5: Complex laceration repair left index finger multiple lacerations greater than 6 cm. #6: Radiographs 2 views left index finger;  Surgeon: Bradly Bienenstock, MD;  Location: [facility] OR;  Service: Orthopedics;  Laterality: Left;   PLACEMENT OF BREAST IMPLANTS     TENDON REPAIR Left 11/21/2020   Procedure: #1: Open debridement of skin subcutaneous tissue and bone associated with left index finger open distal phalangeal fracture and DIP joint subluxation #2: Open treatment of left index finger distal phalanx fracture involving the articular portion of the DIP joint requiring internal fixation #3: Open treatment of left index finger volar plate laceration with primary volar plate repair #4:     Family History  Problem Relation Age of Onset   Lung cancer Maternal Aunt    Skin cancer Maternal Uncle    Stomach cancer Maternal Uncle     Social History    Socioeconomic History   Marital status: Widowed    Spouse name: Not on file   Number of children: Not on file   Years of education: Not on file   Highest education level: Not on file  Occupational History   Not on file  Tobacco Use   Smoking status: Never   Smokeless tobacco: Never  Vaping Use   Vaping status: Never Used  Substance and Sexual Activity   Alcohol use: No   Drug use: No   Sexual activity: Not Currently    Birth control/protection: Post-menopausal  Other Topics Concern   Not on file  Social History Narrative   Not on file   Social Determinants of Health   Financial Resource Strain: Not on file  Food Insecurity: Not on file  Transportation Needs: Not on file  Physical Activity: Not on file  Stress: Not on file  Social Connections: Not on file  Intimate Partner Violence: Not on file     Physical Exam   Vitals:   04/01/23 0000 04/01/23 0015  BP: (!) 143/90 (!) 139/90  Pulse: 70 66  Resp:  18  Temp:    SpO2: 98% 98%    CONSTITUTIONAL: Well-appearing, NAD NEURO/PSYCH:  Alert and oriented x 3, normal and symmetric strength and sensation, normal coordination, normal speech EYES:  eyes equal and reactive, no nystagmus ENT/NECK:  no LAD, no JVD CARDIO: Regular rate, well-perfused, normal S1 and S2 PULM:  CTAB no wheezing or  rhonchi GI/GU:  non-distended, non-tender MSK/SPINE:  No gross deformities, no edema SKIN:  no rash, atraumatic   *Additional and/or pertinent findings included in MDM below  Diagnostic and Interventional Summary    EKG Interpretation Date/Time:  Wednesday March 31 2023 20:46:43 EDT Ventricular Rate:  74 PR Interval:  137 QRS Duration:  109 QT Interval:  427 QTC Calculation: 474 R Axis:   45  Text Interpretation: Sinus rhythm RSR' in V1 or V2, probably normal variant Minimal ST depression, lateral leads Confirmed by Kennis Carina [phone]) on 03/31/2023 11:27:02 PM       Labs Reviewed  COMPREHENSIVE METABOLIC PANEL -  Abnormal; Notable for the following components:      Result Value   Potassium 3.1 (*)    Glucose, Bld 103 (*)    Calcium 8.6 (*)    All other components within normal limits  CBC WITH DIFFERENTIAL/PLATELET  TROPONIN I (HIGH SENSITIVITY)    No orders to display    Medications  hydrALAZINE (APRESOLINE) tablet 25 mg (25 mg Oral Given 03/31/23 2335)     Procedures  /  Critical Care Procedures  ED Course and Medical Decision Making  Initial Impression and Ddx The right has minimal erythema to the conjunctive, does not seem consistent with conjunctivitis, oblique mildly irritated patient is not having any discomfort or tearing, no vision loss nothing to do at this time.  Close monitoring at home.  Patient does not have any neurological deficits, no chest pain shortness of breath, having some dizziness described as lightheadedness, which is very mild at this time.  Highly doubt emergent CNS process, highly doubt emergent cardiopulmonary process.  Suspect largely asymptomatic hypertension with a component of anxiety.  EKG demonstrating some nonspecific findings, will have screening labs, troponin.  Past medical/surgical history that increases complexity of ED encounter: None  Interpretation of Diagnostics I personally reviewed the EKG and my interpretation is as follows: Sinus rhythm with nonspecific ST segment findings, no prior for comparison  Labs reassuring with no significant blood count or electrolyte disturbance.  Troponin is negative.  Patient Reassessment and Ultimate Disposition/Management     With single dose hydralazine blood pressure is much improved.  Patient without symptoms on reassessment.  No emergent process, appropriate for discharge.  Patient management required discussion with the following services or consulting groups:  None  Complexity of Problems Addressed Acute illness or injury that poses threat of life of bodily function  Additional Data Reviewed and  Analyzed Further history obtained from: Further history from spouse/family member  Additional Factors Impacting ED Encounter Risk None  Elmer Sow. Pilar Plate, MD [facility] Emergency [facility] [email or web address]  Final Clinical Impressions(s) / ED Diagnoses     ICD-10-CM   1. Hypertension, unspecified type  I10       ED Discharge Orders     None        Discharge Instructions Discussed with and Provided to Patient:     Discharge Instructions      You were evaluated in the Emergency Department and after careful evaluation, we did not find any emergent condition requiring admission or further testing in the hospital.  Your exam/testing today is overall reassuring.  Recommend follow-up with your primary care doctor within the next week or 2 for blood pressure recheck.  Please return to the Emergency Department if you experience any worsening of your condition.   Thank you for allowing [place] to be a part of your care.  Sabas Sous, MD 04/01/23 [phone]

## 2023-03-31 NOTE — ED Triage Notes (Addendum)
Pt states she was feeling "lightheaded" took her BP and reading was 172/95 Is not  on BP meds States feels dizzy in triage Does not have hx of HTN

## 2023-04-01 LAB — TROPONIN I (HIGH SENSITIVITY): Troponin I (High Sensitivity): 4 ng/L (ref <18)

## 2023-04-01 NOTE — Discharge Instructions (Signed)
You were evaluated in the Emergency Department and after careful evaluation, we did not find any emergent condition requiring admission or further testing in the hospital.  Your exam/testing today is overall reassuring.  Recommend follow-up with your primary care doctor within the next week or 2 for blood pressure recheck.  Please return to the Emergency Department if you experience any worsening of your condition.   Thank you for allowing [place] to be a part of your care.

## 2023-04-29 ENCOUNTER — Encounter: Payer: Self-pay | Admitting: Family Medicine

## 2023-04-29 ENCOUNTER — Ambulatory Visit (INDEPENDENT_AMBULATORY_CARE_PROVIDER_SITE_OTHER): Payer: 59 | Admitting: Family Medicine

## 2023-04-29 VITALS — BP 131/92 | HR 99 | Ht 68.0 in | Wt 129.0 lb

## 2023-04-29 DIAGNOSIS — R030 Elevated blood-pressure reading, without diagnosis of hypertension: Secondary | ICD-10-CM | POA: Insufficient documentation

## 2023-04-29 DIAGNOSIS — Z0000 Encounter for general adult medical examination without abnormal findings: Secondary | ICD-10-CM | POA: Insufficient documentation

## 2023-04-29 DIAGNOSIS — Z23 Encounter for immunization: Secondary | ICD-10-CM

## 2023-04-29 NOTE — Progress Notes (Signed)
Established patient visit   Patient: Maria Hanson   DOB: 04-05-1966   57 y.o. Female  MRN: [id number] Visit Date: 04/29/2023  Today's healthcare provider: Charlton Amor, DO   Chief Complaint  Patient presents with   Annual Exam    SUBJECTIVE    Chief Complaint  Patient presents with   Annual Exam   HPI  Diet: very healthy Exercise: daily  Screenings: Colon Cancer screenings (start at age 12): due in January  Pap smears: June 2024- normal Mammograms: order placed by gyn June 2024  Family hx: HTN: father but likely lifestyle related he was a smoker and had bypass surgery  DM: yes  Cancer: none  Social hx: Alcohol use: none Tobacco (chew, smoke): none  Who do you live with: dogs and son  Safe at home: yes   She is still dealing with a lot with her late husband but she is going to therapy    Review of Systems  Constitutional:  Negative for activity change, fatigue and fever.  Respiratory:  Negative for cough and shortness of breath.   Cardiovascular:  Negative for chest pain.  Gastrointestinal:  Negative for abdominal pain.  Genitourinary:  Negative for difficulty urinating.       No outpatient medications have been marked as taking for the 04/29/23 encounter (Office Visit) with Charlton Amor, DO.    OBJECTIVE    LMP 06/08/2017   Physical Exam Vitals and nursing note reviewed.  Constitutional:      General: She is not in acute distress.    Appearance: Normal appearance.  HENT:     Head: Normocephalic and atraumatic.     Right Ear: External ear normal.     Left Ear: External ear normal.     Nose: Nose normal.  Eyes:     Conjunctiva/sclera: Conjunctivae normal.  Cardiovascular:     Rate and Rhythm: Normal rate and regular rhythm.  Pulmonary:     Effort: Pulmonary effort is normal.     Breath sounds: Normal breath sounds.  Neurological:     General: No focal deficit present.     Mental Status: She is alert and oriented to person, place,  and time.  Psychiatric:        Mood and Affect: Mood normal.        Behavior: Behavior normal.        Thought Content: Thought content normal.        Judgment: Judgment normal.        ASSESSMENT & PLAN    Problem List Items Addressed This Visit       Other   Routine adult health maintenance - Primary   Relevant Orders   CBC   Lipid panel   Basic Metabolic Panel (BMET)   Other Visit Diagnoses     Encounter for immunization       Relevant Orders   Varicella-zoster vaccine IM (Completed)       No follow-ups on file.      No orders of the defined types were placed in this encounter.   Orders Placed This Encounter  Procedures   Varicella-zoster vaccine IM   CBC   Lipid panel    Order Specific Question:   Has the patient fasted?    Answer:   No    Order Specific Question:   Release to patient    Answer:   Immediate   Basic Metabolic Panel (BMET)    Order Specific Question:  Has the patient fasted?    Answer:   No     Charlton Amor, DO  [facility] Medicine at [facility] [phone] (phone) [phone] (fax)  [facility]

## 2023-04-29 NOTE — Patient Instructions (Signed)
Salon pas patches   Heating pad   Neck stretches

## 2023-04-29 NOTE — Assessment & Plan Note (Signed)
-   pt has been more stressed lately with late husband  - had to go to the ED because she got so upset

## 2023-04-30 LAB — LIPID PANEL
Chol/HDL Ratio: 2.6 ratio (ref 0.0–4.4)
Cholesterol, Total: 293 mg/dL — ABNORMAL HIGH (ref 100–199)
HDL: 113 mg/dL (ref >39)
LDL Chol Calc (NIH): 172 mg/dL — ABNORMAL HIGH (ref 0–99)
Triglycerides: 57 mg/dL (ref 0–149)
VLDL Cholesterol Cal: 8 mg/dL (ref 5–40)

## 2023-04-30 LAB — BASIC METABOLIC PANEL
BUN/Creatinine Ratio: 38 — ABNORMAL HIGH (ref 9–23)
BUN: 21 mg/dL (ref 6–24)
CO2: 23 mmol/L (ref 20–29)
Calcium: 9.1 mg/dL (ref 8.7–10.2)
Chloride: 103 mmol/L (ref 96–106)
Creatinine, Ser: 0.55 mg/dL — ABNORMAL LOW (ref 0.57–1.00)
Glucose: 80 mg/dL (ref 70–99)
Potassium: 4.7 mmol/L (ref 3.5–5.2)
Sodium: 139 mmol/L (ref 134–144)
eGFR: 106 mL/min/{1.73_m2} (ref >59)

## 2023-04-30 LAB — CBC
Hematocrit: 46.6 % (ref 34.0–46.6)
Hemoglobin: 14.4 g/dL (ref 11.1–15.9)
MCH: 29.1 pg (ref 26.6–33.0)
MCHC: 30.9 g/dL — ABNORMAL LOW (ref 31.5–35.7)
MCV: 94 fL (ref 79–97)
Platelets: 275 10*3/uL (ref 150–450)
RBC: 4.94 x10E6/uL (ref 3.77–5.28)
RDW: 12.5 % (ref 11.7–15.4)
WBC: 3.9 10*3/uL (ref 3.4–10.8)

## 2023-07-29 ENCOUNTER — Ambulatory Visit: Payer: 59 | Admitting: *Deleted

## 2023-07-29 ENCOUNTER — Telehealth: Payer: Self-pay | Admitting: *Deleted

## 2023-07-29 ENCOUNTER — Ambulatory Visit: Payer: 59

## 2023-07-29 VITALS — Ht 68.0 in

## 2023-07-29 DIAGNOSIS — Z23 Encounter for immunization: Secondary | ICD-10-CM

## 2023-07-29 NOTE — Progress Notes (Signed)
Pt here for 3rd Gardasil injection.

## 2023-07-29 NOTE — Telephone Encounter (Signed)
Mailbox full

## 2023-12-23 ENCOUNTER — Encounter: Payer: Self-pay | Admitting: Family Medicine

## 2024-03-03 ENCOUNTER — Encounter: Payer: Self-pay | Admitting: Advanced Practice Midwife
